# Patient Record
Sex: Male | Born: 1965 | Race: Black or African American | Hispanic: No | Marital: Married | Smoking: Current every day smoker
Health system: Southern US, Community
[De-identification: ages and names within clinical notes are randomized; demographics above are authoritative.]

## PROBLEM LIST (undated history)

## (undated) DIAGNOSIS — I639 Cerebral infarction, unspecified: Secondary | ICD-10-CM

## (undated) DIAGNOSIS — F1010 Alcohol abuse, uncomplicated: Secondary | ICD-10-CM

---

## 2002-09-19 ENCOUNTER — Encounter: Payer: Self-pay | Admitting: Emergency Medicine

## 2002-09-19 ENCOUNTER — Emergency Department (HOSPITAL_COMMUNITY): Admission: EM | Admit: 2002-09-19 | Discharge: 2002-09-20 | Payer: Self-pay | Admitting: Emergency Medicine

## 2007-12-31 ENCOUNTER — Emergency Department (HOSPITAL_COMMUNITY): Admission: EM | Admit: 2007-12-31 | Discharge: 2008-01-01 | Payer: Self-pay | Admitting: Emergency Medicine

## 2012-01-11 ENCOUNTER — Encounter (HOSPITAL_BASED_OUTPATIENT_CLINIC_OR_DEPARTMENT_OTHER): Payer: Self-pay | Admitting: Emergency Medicine

## 2012-01-11 ENCOUNTER — Emergency Department (HOSPITAL_BASED_OUTPATIENT_CLINIC_OR_DEPARTMENT_OTHER)
Admission: EM | Admit: 2012-01-11 | Discharge: 2012-01-11 | Disposition: A | Discharge status: Home / Self Care | Payer: Self-pay | Attending: Emergency Medicine | Admitting: Emergency Medicine

## 2012-01-11 DIAGNOSIS — L02419 Cutaneous abscess of limb, unspecified: Secondary | ICD-10-CM | POA: Insufficient documentation

## 2012-01-11 DIAGNOSIS — L03116 Cellulitis of left lower limb: Secondary | ICD-10-CM

## 2012-01-11 MED ORDER — OXYCODONE-ACETAMINOPHEN 5-325 MG PO TABS
1.0000 | ORAL_TABLET | Freq: Once | ORAL | Status: AC
  Administered 2012-01-11: 1 via ORAL
  Filled 2012-01-11 (×2): qty 1

## 2012-01-11 MED ORDER — CLINDAMYCIN HCL 150 MG PO CAPS
300.0000 mg | ORAL_CAPSULE | Freq: Once | ORAL | Status: AC
Start: 2012-01-11 — End: 2012-01-11
  Administered 2012-01-11: 300 mg via ORAL
  Filled 2012-01-11: qty 2

## 2012-01-11 MED ORDER — CLINDAMYCIN HCL 300 MG PO CAPS: 300.0000 mg | ORAL_CAPSULE | Freq: Three times a day (TID) | ORAL | Status: DC

## 2012-01-11 MED ORDER — OXYCODONE-ACETAMINOPHEN 5-325 MG PO TABS
1.0000 | ORAL_TABLET | Freq: Four times a day (QID) | ORAL | Status: DC | PRN
Start: 2012-01-11 — End: 2013-11-03

## 2012-01-11 NOTE — ED Provider Notes (Signed)
History   This chart was scribed for Ethelda Chick, MD by Sofie Rower. The patient was seen in room MH03/MH03 and the patient's care was started at 4:37PM   CSN: [id number]  Arrival date & time 01/11/12  1600   First MD Initiated Contact with Patient 01/11/12 1637      Chief Complaint  Patient presents with  . Insect Bite    (Consider location/radiation/quality/duration/timing/severity/associated sxs/prior treatment) Patient is a 46 y.o. male presenting with allergic reaction. The history is provided by the patient. No language interpreter was used.  Allergic Reaction The primary symptoms do not include nausea, vomiting, dizziness or rash. Primary symptoms comment: Blister The current episode started more than 2 days ago. The problem has been gradually worsening. This is a new problem.  The onset of the reaction was associated with insect bite/sting. Significant symptoms that are not present include eye redness, flushing or itching.    Derrick Houston is a 46 y.o. male who presents to the Emergency Department complaining of  sudden, progressively worsening insect bite, located at the left lower extremity, onset three days ago, with associated symptoms of fever (temperature unknown), blistering, erythema, and swelling located at the left lower extremity. The pt reports he was working in the attic on Saturday, 01/09/12, and is currently experiencing an aching sensation located at the insect bite location. The pt informs that he is concerned that he may have been bitten by a spider.  The pt denies allergies to any medications.   The pt does not smoke or drink alcohol.     History reviewed. No pertinent past medical history.  History reviewed. No pertinent past surgical history.  No family history on file.  History  Substance Use Topics  . Smoking status: Not on file  . Smokeless tobacco: Former Neurosurgeon  . Alcohol Use: No      Review of Systems  Eyes: Negative for redness.    Gastrointestinal: Negative for nausea and vomiting.  Skin: Negative for flushing, itching and rash.  Neurological: Negative for dizziness.  All other systems reviewed and are negative.    Allergies  Review of patient's allergies indicates no known allergies.  Home Medications   Current Outpatient Rx  Name Route Sig Dispense Refill  . CLINDAMYCIN HCL 300 MG PO CAPS Oral Take 1 capsule (300 mg total) by mouth 3 (three) times daily. 21 capsule 0  . OXYCODONE-ACETAMINOPHEN 5-325 MG PO TABS Oral Take 1-2 tablets by mouth every 6 (six) hours as needed for pain. 15 tablet 0    BP 125/90  Pulse 82  Temp 98.8 F (37.1 C) (Oral)  Resp 18  Ht 5\' 9"  (1.753 m)  Wt 195 lb (88.451 kg)  BMI 28.80 kg/m2  SpO2 98%  Physical Exam  Nursing note and vitals reviewed. Constitutional: He is oriented to person, place, and time. He appears well-developed and well-nourished.       The pt does not appear toxic.   HENT:  Head: Atraumatic.  Right Ear: External ear normal.  Left Ear: External ear normal.  Nose: Nose normal.  Eyes: Conjunctivae normal and EOM are normal.  Neck: Normal range of motion. Neck supple.  Pulmonary/Chest: Effort normal.  Musculoskeletal: Normal range of motion.  Neurological: He is alert and oriented to person, place, and time.  Skin: Skin is warm and dry.       5 mm blister, lesion located at the left anterior tibia, erythema and warmth involving the anterior tibial region, 10-12 cm.  No fluctuance, no induration, no streaking of erythema up the extremity.   Psychiatric: He has a normal mood and affect. His behavior is normal.    ED Course  Procedures (including critical care time)  DIAGNOSTIC STUDIES: Oxygen Saturation is 98% on room air, normal by my interpretation.    COORDINATION OF CARE:    4:54PM- Management of infection and application of antibiotics discussed with patient. Pt agrees with treatment.   Labs Reviewed - No data to display No results  found.   1. Cellulitis of left lower extremity       MDM  Pt presents with area of redness and pain surrounding a possible spider bite.  No evidence of abscess, pt nontoxic in appearance with reassuring vitals.  Pt started on antibiotics in the ED for cellulitis of lower extremity.  Advised to f/u with PMD or here in the ED for a recheck in 48 hours.  Discharged with strict return precautions.  Pt agreeable with plan.    I personally performed the services described in this documentation, which was scribed in my presence. The recorded information has been reviewed and considered.    Ethelda Chick, MD 01/11/12 [phone]

## 2012-01-11 NOTE — ED Notes (Signed)
Poss spider bite to left lower leg x 3 days ago.  blister

## 2012-06-08 ENCOUNTER — Encounter (HOSPITAL_BASED_OUTPATIENT_CLINIC_OR_DEPARTMENT_OTHER): Payer: Self-pay | Admitting: *Deleted

## 2012-06-08 ENCOUNTER — Emergency Department (HOSPITAL_BASED_OUTPATIENT_CLINIC_OR_DEPARTMENT_OTHER)
Admission: EM | Admit: 2012-06-08 | Discharge: 2012-06-08 | Disposition: A | Discharge status: Home / Self Care | Payer: No Typology Code available for payment source | Attending: Emergency Medicine | Admitting: Emergency Medicine

## 2012-06-08 ENCOUNTER — Emergency Department (HOSPITAL_BASED_OUTPATIENT_CLINIC_OR_DEPARTMENT_OTHER): Payer: No Typology Code available for payment source

## 2012-06-08 DIAGNOSIS — Y9389 Activity, other specified: Secondary | ICD-10-CM | POA: Insufficient documentation

## 2012-06-08 DIAGNOSIS — S161XXA Strain of muscle, fascia and tendon at neck level, initial encounter: Secondary | ICD-10-CM

## 2012-06-08 DIAGNOSIS — S139XXA Sprain of joints and ligaments of unspecified parts of neck, initial encounter: Secondary | ICD-10-CM | POA: Insufficient documentation

## 2012-06-08 DIAGNOSIS — S39012A Strain of muscle, fascia and tendon of lower back, initial encounter: Secondary | ICD-10-CM

## 2012-06-08 DIAGNOSIS — Y92410 Unspecified street and highway as the place of occurrence of the external cause: Secondary | ICD-10-CM | POA: Insufficient documentation

## 2012-06-08 DIAGNOSIS — Z87891 Personal history of nicotine dependence: Secondary | ICD-10-CM | POA: Insufficient documentation

## 2012-06-08 DIAGNOSIS — S335XXA Sprain of ligaments of lumbar spine, initial encounter: Secondary | ICD-10-CM | POA: Insufficient documentation

## 2012-06-08 MED ORDER — CYCLOBENZAPRINE HCL 10 MG PO TABS: 10.0000 mg | ORAL_TABLET | Freq: Two times a day (BID) | ORAL | Status: DC | PRN

## 2012-06-08 MED ORDER — IBUPROFEN 800 MG PO TABS: 800.0000 mg | ORAL_TABLET | Freq: Three times a day (TID) | ORAL | Status: DC

## 2012-06-08 MED ORDER — TRAMADOL HCL 50 MG PO TABS: 50.0000 mg | ORAL_TABLET | Freq: Four times a day (QID) | ORAL | Status: DC | PRN
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
## 2013-11-03 ENCOUNTER — Encounter (HOSPITAL_COMMUNITY): Payer: Self-pay | Admitting: Emergency Medicine

## 2013-11-03 ENCOUNTER — Emergency Department (HOSPITAL_COMMUNITY)
Admission: EM | Admit: 2013-11-03 | Discharge: 2013-11-03 | Disposition: A | Discharge status: Home / Self Care | Payer: Worker's Compensation | Attending: Emergency Medicine | Admitting: Emergency Medicine

## 2013-11-03 ENCOUNTER — Emergency Department (HOSPITAL_COMMUNITY): Payer: Worker's Compensation

## 2013-11-03 DIAGNOSIS — W3189XA Contact with other specified machinery, initial encounter: Secondary | ICD-10-CM | POA: Insufficient documentation

## 2013-11-03 DIAGNOSIS — Y9289 Other specified places as the place of occurrence of the external cause: Secondary | ICD-10-CM | POA: Insufficient documentation

## 2013-11-03 DIAGNOSIS — Y990 Civilian activity done for income or pay: Secondary | ICD-10-CM | POA: Insufficient documentation

## 2013-11-03 DIAGNOSIS — Z87891 Personal history of nicotine dependence: Secondary | ICD-10-CM | POA: Insufficient documentation

## 2013-11-03 DIAGNOSIS — S51809A Unspecified open wound of unspecified forearm, initial encounter: Secondary | ICD-10-CM | POA: Insufficient documentation

## 2013-11-03 DIAGNOSIS — S51812A Laceration without foreign body of left forearm, initial encounter: Secondary | ICD-10-CM

## 2013-11-03 DIAGNOSIS — Y9389 Activity, other specified: Secondary | ICD-10-CM | POA: Diagnosis not present

## 2013-11-03 DIAGNOSIS — Z7982 Long term (current) use of aspirin: Secondary | ICD-10-CM | POA: Diagnosis not present

## 2013-11-03 DIAGNOSIS — Z23 Encounter for immunization: Secondary | ICD-10-CM | POA: Insufficient documentation

## 2013-11-03 HISTORY — DX: Alcohol abuse, uncomplicated: F10.10

## 2013-11-03 LAB — CBG MONITORING, ED: Glucose-Capillary: 114 mg/dL — ABNORMAL HIGH (ref 70–99)

## 2013-11-03 MED ORDER — FENTANYL CITRATE 0.05 MG/ML IJ SOLN
50.0000 ug | Freq: Once | INTRAMUSCULAR | Status: AC
  Administered 2013-11-03: 50 ug via INTRAVENOUS
  Filled 2013-11-03: qty 2

## 2013-11-03 MED ORDER — ONDANSETRON HCL 4 MG/2ML IJ SOLN
4.0000 mg | Freq: Once | INTRAMUSCULAR | Status: AC
  Administered 2013-11-03: 4 mg via INTRAVENOUS
  Filled 2013-11-03: qty 2

## 2013-11-03 MED ORDER — TETANUS-DIPHTH-ACELL PERTUSSIS 5-2.5-18.5 LF-MCG/0.5 IM SUSP
0.5000 mL | Freq: Once | INTRAMUSCULAR | Status: AC
  Administered 2013-11-03: 0.5 mL via INTRAMUSCULAR
  Filled 2013-11-03: qty 0.5

## 2013-11-03 MED ORDER — LIDOCAINE-EPINEPHRINE 1 %-1:100000 IJ SOLN
20.0000 mL | Freq: Once | INTRAMUSCULAR | Status: DC
  Filled 2013-11-03: qty 20

## 2013-11-03 MED ORDER — LIDOCAINE-EPINEPHRINE (PF) 1 %-1:200000 IJ SOLN
30.0000 mL | Freq: Once | INTRAMUSCULAR | Status: AC
  Administered 2013-11-03: 30 mL

## 2013-11-03 MED ORDER — OXYCODONE-ACETAMINOPHEN 5-325 MG PO TABS: 1.0000 | ORAL_TABLET | ORAL | Status: DC | PRN

## 2013-11-03 MED ORDER — TETANUS-DIPHTH-ACELL PERTUSSIS 5-2.5-18.5 LF-MCG/0.5 IM SUSP: 0.5000 mL | Freq: Once | INTRAMUSCULAR | Status: DC

## 2013-11-03 MED ORDER — HYDROMORPHONE HCL PF 2 MG/ML IJ SOLN
2.0000 mg | Freq: Once | INTRAMUSCULAR | Status: AC
  Administered 2013-11-03: 2 mg via INTRAVENOUS
  Filled 2013-11-03: qty 1

## 2013-11-03 NOTE — Discharge Instructions (Signed)
Call Dr. Carollee Massedhompson's office for recheck asap.  Keep arm elevated.  Return if pain worsens and severe swelling in forearm.     Laceration Care, Adult A laceration is a cut that goes through all layers of the skin. The cut goes into the tissue beneath the skin. HOME CARE For stitches (sutures) or staples:  Keep the cut clean and dry.  If you have a bandage (dressing), change it at least once a day. Change the bandage if it gets wet or dirty, or as told by your doctor.  Wash the cut with soap and water 2 times a day. Rinse the cut with water. Pat it dry with a clean towel.  Put a thin layer of medicated cream on the cut as told by your doctor.  You may shower after the first 24 hours. Do not soak the cut in water until the stitches are removed.  Only take medicines as told by your doctor.  Have your stitches or staples removed as told by your doctor. For skin adhesive strips:  Keep the cut clean and dry.  Do not get the strips wet. You may take a bath, but be careful to keep the cut dry.  If the cut gets wet, pat it dry with a clean towel.  The strips will fall off on their own. Do not remove the strips that are still stuck to the cut. For wound glue:  You may shower or take baths. Do not soak or scrub the cut. Do not swim. Avoid heavy sweating until the glue falls off on its own. After a shower or bath, pat the cut dry with a clean towel.  Do not put medicine on your cut until the glue falls off.  If you have a bandage, do not put tape over the glue.  Avoid lots of sunlight or tanning lamps until the glue falls off. Put sunscreen on the cut for the first year to reduce your scar.  The glue will fall off on its own. Do not pick at the glue. You may need a tetanus shot if:  You cannot remember when you had your last tetanus shot.  You have never had a tetanus shot. If you need a tetanus shot and you choose not to have one, you may get tetanus. Sickness from tetanus can be  serious. GET HELP RIGHT AWAY IF:   Your pain does not get better with medicine.  Your arm, hand, leg, or foot loses feeling (numbness) or changes color.  Your cut is bleeding.  Your joint feels weak, or you cannot use your joint.  You have painful lumps on your body.  Your cut is red, puffy (swollen), or painful.  You have a red line on the skin near the cut.  You have yellowish-white fluid (pus) coming from the cut.  You have a fever.  You have a bad smell coming from the cut or bandage.  Your cut breaks open before or after stitches are removed.  You notice something coming out of the cut, such as wood or glass.  You cannot move a finger or toe. MAKE SURE YOU:   Understand these instructions.  Will watch your condition.  Will get help right away if you are not doing well or get worse. Document Released: 09/16/2007 Document Revised: 06/22/2011 Document Reviewed: 09/23/2010 [facility] Patient Information 2015 [place], [place]. This information is not intended to replace advice given to you by your health care provider. Make sure you discuss any questions you  have with your health care provider. ° °

## 2013-11-03 NOTE — ED Notes (Signed)
Family at bedside. 

## 2013-11-03 NOTE — ED Notes (Signed)
Pt sts that he was working with a grinder when he cut his L forearm. Pt noted to have two deep lacerations. Not bleeding during this triage. Radial pulses +2. Able to move all digits some difficulty moving L wrist. Pt is A/O at this time.

## 2013-11-03 NOTE — ED Notes (Signed)
John ortho tech at bedside apply splint and sling

## 2013-11-03 NOTE — ED Notes (Signed)
Patient transported to X-ray 

## 2013-11-03 NOTE — ED Provider Notes (Signed)
CSN: [id number]     Arrival date & time 11/03/13  1034 History   First MD Initiated Contact with Patient 11/03/13 1101     Chief Complaint  Patient presents with  . Extremity Laceration     (Consider location/radiation/quality/duration/timing/severity/associated sxs/prior Treatment) HPI 48 y.o. Male with laceration to left forearm from grinder at work. Patient complaining of  Pain in left forearm  Radiating to biceps  Past Medical History  Diagnosis Date  . ETOH abuse    History reviewed. No pertinent past surgical history. History reviewed. No pertinent family history. History  Substance Use Topics  . Smoking status: Former Games developermoker  . Smokeless tobacco: Former NeurosurgeonUser  . Alcohol Use: No    Review of Systems    Allergies  Review of patient's allergies indicates no known allergies.  Home Medications   Prior to Admission medications   Medication Sig Start Date End Date Taking? Authorizing Provider  Aspirin-Acetaminophen (GOODY BODY PAIN) 500-325 MG PACK Take 1 packet by mouth as needed (for pain).   Yes Historical Provider, MD   BP 110/68  Pulse 72  Temp(Src) 97.4 F (36.3 C) (Oral)  Resp 22  Ht 5\' 9"  (1.753 m)  Wt 180 lb (81.647 kg)  BMI 26.57 kg/m2  SpO2 99% Physical Exam  Nursing note and vitals reviewed. Constitutional: He is oriented to person, place, and time. He appears well-developed and well-nourished.  HENT:  Head: Normocephalic and atraumatic.  Eyes: Conjunctivae and EOM are normal. Pupils are equal, round, and reactive to light.  Neck: Normal range of motion. Neck supple.  Cardiovascular: Normal rate, regular rhythm, normal heart sounds and intact distal pulses.   Pulmonary/Chest: Effort normal and breath sounds normal.  Abdominal: Soft.  Musculoskeletal:       Left elbow: He exhibits decreased range of motion, swelling and laceration. He exhibits no effusion and no deformity. Tenderness found.       Arms: 12 cm laceration x 2 left medial volar aspect  of left forearm approximately 10 cm distal to medial epicondyle.  Patient with pain with any wrist flexion/extension, radial pulse 2+, two point discrimination intact.   Neurological: He is alert and oriented to person, place, and time. He has normal reflexes.  Skin: Skin is warm and dry.  Psychiatric: He has a normal mood and affect. His behavior is normal. Judgment and thought content normal.    ED Course  LACERATION REPAIR Date/Time: 11/03/2013 2:02 PM Performed by: Hilario QuarryAY, Latorria Zeoli S Authorized by: Hilario QuarryAY, Nahun Kronberg S Consent: Verbal consent obtained. Risks and benefits: risks, benefits and alternatives were discussed Patient identity confirmed: verbally with patient and arm band Time out: Immediately prior to procedure a "time out" was called to verify the correct patient, procedure, equipment, support staff and site/side marked as required. Body area: upper extremity Laceration length: 12 cm Foreign bodies: no foreign bodies Nerve involvement: none Vascular damage: no Anesthesia: local infiltration Local anesthetic: lidocaine 1% with epinephrine Anesthetic total: 7 ml Patient sedated: no Preparation: Patient was prepped and draped in the usual sterile fashion. Irrigation solution: saline Irrigation method: syringe Amount of cleaning: standard Debridement: none Degree of undermining: none Skin closure: 3-0 Prolene Number of sutures: 5 Technique: simple Approximation: loose Approximation difficulty: simple Patient tolerance: Patient tolerated the procedure well with no immediate complications. Comments: Patient with two lacerations- both 12 cm left volar ulnar aspect of forearm.  Proximal laceration repaired with 7 simple interrupted sutures, distal with 5.  Superficial muscle involved. No fb noted.    (  including critical care time) Labs Review Labs Reviewed  CBG MONITORING, ED - Abnormal; Notable for the following:    Glucose-Capillary 114 (*)    All other components within  normal limits    Imaging Review Dg Forearm Left  11/03/2013   CLINICAL DATA:  Extremity laceration  EXAM: LEFT FOREARM - 2 VIEW  COMPARISON:  None.  FINDINGS: There is no evidence of fracture or other focal bone lesions. Soft tissue laceration along the medial volar aspect of the proximal forearm.  IMPRESSION: Soft tissue laceration of the proximal medial volar aspect of the left forearm without osseous abnormality.   Electronically Signed   By: Elige Ko   On: 11/03/2013 12:03     EKG Interpretation None      MDM   Final diagnoses:  Forearm laceration, left, initial encounter    48 y.o. Male with lacerations to left forearm with muscle involvement.  Hand with fingers held in flexion and patient able to flex and extend wrist but very painful.  Discussed with Dr. Janee Morn and advises loose repair, splint, and he will follow up in office.  Discussed return precautions and specifically s/s infection or compartment syndrome.  Patient and wife voice understanding.     Hilario Quarry, MD 11/03/13 [phone]

## 2015-06-03 ENCOUNTER — Encounter: Payer: Self-pay | Admitting: Physician Assistant

## 2015-06-03 ENCOUNTER — Ambulatory Visit (INDEPENDENT_AMBULATORY_CARE_PROVIDER_SITE_OTHER): Payer: 59 | Admitting: Physician Assistant

## 2015-06-03 VITALS — BP 146/90 | HR 64 | Temp 97.9°F | Resp 16

## 2015-06-03 DIAGNOSIS — Z139 Encounter for screening, unspecified: Secondary | ICD-10-CM | POA: Diagnosis not present

## 2015-06-03 DIAGNOSIS — M5442 Lumbago with sciatica, left side: Secondary | ICD-10-CM

## 2015-06-03 LAB — POCT GLYCOSYLATED HEMOGLOBIN (HGB A1C): HEMOGLOBIN A1C: 5.9

## 2015-06-03 MED ORDER — PREDNISONE 20 MG PO TABS: ORAL_TABLET | ORAL | Status: AC

## 2015-06-03 MED ORDER — METHYLPREDNISOLONE SODIUM SUCC 125 MG IJ SOLR
125.0000 mg | Freq: Once | INTRAMUSCULAR | Status: AC
  Administered 2015-06-03: 125 mg via INTRAMUSCULAR

## 2015-06-03 NOTE — Progress Notes (Signed)
06/03/2015 5:27 PM   DOB: November 22, 1965 / MRN: [id number]  SUBJECTIVE:  Derrick Houston is a 50 y.o. male presenting for the evaluation of stable "achy" right sided low back pain that started 4 days ago. Associated symptoms include tingling, and he denies dysuria, leg weakness.Treatments tried thus far include Norcos, Ibuprofen 800 mg q8 and Flexeril with some relief. He denies fever, nausea, dysuria, frequency and urgency.   He has No Known Allergies.   He  has a past medical history of ETOH abuse.    He  reports that he has quit smoking. He has quit using smokeless tobacco. He reports that he does not drink alcohol or use illicit drugs. He  has no sexual activity history on file. The patient  has no past surgical history on file.  His family history is not on file.  Review of Systems  Constitutional: Negative for fever and chills.  Cardiovascular: Negative for chest pain.  Skin: Negative for rash.  Neurological: Negative for headaches.    Problem list and medications reviewed and updated by myself where necessary, and exist elsewhere in the encounter.   OBJECTIVE:  BP 146/90 mmHg  Pulse 64  Temp(Src) 97.9 F (36.6 C)  Resp 16  SpO2 98% CrCl cannot be calculated (Unknown ideal weight.).  Physical Exam  Constitutional: He is oriented to person, place, and time. He appears well-developed. He does not appear ill.  Eyes: Conjunctivae and EOM are normal. Pupils are equal, round, and reactive to light.  Cardiovascular: Normal rate.   Pulmonary/Chest: Effort normal.  Abdominal: He exhibits no distension.  Musculoskeletal: Normal range of motion.  Neurological: He is alert and oriented to person, place, and time. He has normal strength and normal reflexes. He displays no atrophy and no tremor. No cranial nerve deficit or sensory deficit. He exhibits normal muscle tone. He displays a negative Romberg sign. He displays no seizure activity. Coordination and gait normal. GCS eye subscore  is 4. GCS verbal subscore is 5. GCS motor subscore is 6.  Skin: Skin is warm and dry. He is not diaphoretic.  Psychiatric: He has a normal mood and affect.  Nursing note and vitals reviewed.   Results for orders placed or performed in visit on 06/03/15 (from the past 48 hour(s))  POCT glycosylated hemoglobin (Hb A1C)     Status: None   Collection Time: 06/03/15  5:15 PM  Result Value Ref Range   Hemoglobin A1C 5.9     No results found.  ASSESSMENT AND PLAN  Diagnoses and all orders for this visit:  Screening -     POCT glycosylated hemoglobin (Hb A1C)  Low back pain with radiation, left: He is having severe pain today.  He has tried opioids and states this makes him sick.  He has had steroid injections in the past for similar back complaints with excellent relief.  Will start that today. His pressure is mildly elevated.  This is most likely due to pain today.   -     methylPREDNISolone sodium succinate (SOLU-MEDROL) 125 mg/2 mL injection 125 mg; Inject 2 mLs (125 mg total) into the muscle once. -     predniSONE (DELTASONE) 20 MG tablet; Take 3 in the morning for 3 days, then 2 in the morning for 3 days, and then 1 in the morning for 3 days.    The patient was advised to call or return to clinic if he does not see an improvement in symptoms or to seek the care  of the closest emergency department if he worsens with the above plan.   Deliah Boston, MHS, PA-C [facility] 06/03/2015 5:27 PM

## 2016-06-30 ENCOUNTER — Emergency Department (HOSPITAL_BASED_OUTPATIENT_CLINIC_OR_DEPARTMENT_OTHER)
Admission: EM | Admit: 2016-06-30 | Discharge: 2016-07-01 | Disposition: A | Discharge status: Home / Self Care | Payer: 59 | Attending: Emergency Medicine | Admitting: Emergency Medicine

## 2016-06-30 ENCOUNTER — Encounter (HOSPITAL_BASED_OUTPATIENT_CLINIC_OR_DEPARTMENT_OTHER): Payer: Self-pay | Admitting: Emergency Medicine

## 2016-06-30 ENCOUNTER — Emergency Department (HOSPITAL_BASED_OUTPATIENT_CLINIC_OR_DEPARTMENT_OTHER): Payer: 59

## 2016-06-30 DIAGNOSIS — R059 Cough, unspecified: Secondary | ICD-10-CM

## 2016-06-30 DIAGNOSIS — R0789 Other chest pain: Secondary | ICD-10-CM | POA: Diagnosis not present

## 2016-06-30 DIAGNOSIS — R509 Fever, unspecified: Secondary | ICD-10-CM | POA: Insufficient documentation

## 2016-06-30 DIAGNOSIS — F17200 Nicotine dependence, unspecified, uncomplicated: Secondary | ICD-10-CM | POA: Diagnosis not present

## 2016-06-30 DIAGNOSIS — R05 Cough: Secondary | ICD-10-CM | POA: Diagnosis not present

## 2016-06-30 MED ORDER — ACETAMINOPHEN 500 MG PO TABS
1000.0000 mg | ORAL_TABLET | Freq: Once | ORAL | Status: AC
  Administered 2016-06-30: 1000 mg via ORAL
  Filled 2016-06-30: qty 2

## 2016-06-30 MED ORDER — BENZONATATE 100 MG PO CAPS: 100.0000 mg | ORAL_CAPSULE | Freq: Three times a day (TID) | ORAL | 0 refills | Status: AC

## 2016-06-30 NOTE — Discharge Instructions (Signed)
Please read and follow all provided instructions.  Your diagnoses today include:  1. Cough   2. Fever, unspecified fever cause     Tests performed today include:  Chest x-ray - no pneumonia  Vital signs. See below for your results today.   Medications prescribed:   Tessalon Perles - cough suppressant medication  Take any prescribed medications only as directed.  Home care instructions:  Follow any educational materials contained in this packet. Please continue drinking plenty of fluids. Use over-the-counter cold and flu medications as needed as directed on packaging for symptom relief. You may also use ibuprofen or tylenol as directed on packaging for pain or fever.   BE VERY CAREFUL not to take multiple medicines containing Tylenol (also called acetaminophen). Doing so can lead to an overdose which can damage your liver and cause liver failure and possibly death.   Follow-up instructions: Please follow-up with your primary care provider in the next 3 days for further evaluation of your symptoms.   Return instructions:   Please return to the Emergency Department if you experience worsening symptoms.  Please return if you have a high fever greater than 101 degrees not controlled with over-the-counter medications, persistent vomiting and cannot keep down fluids, or worsening trouble breathing.  Please return if you have any other emergent concerns.  Additional Information:  Your vital signs today were: BP 120/76 (BP Location: Right Arm)    Pulse (!) 102    Temp 99.7 F (37.6 C) (Oral)    Resp 16    Ht 5\' 9"  (1.753 m)    Wt 95.3 kg    SpO2 97%    BMI 31.01 kg/m  If your blood pressure (BP) was elevated above 135/85 this visit, please have this repeated by your doctor within one month.

## 2016-06-30 NOTE — ED Triage Notes (Signed)
Pt c/o tightness in his chest with inspiration

## 2016-06-30 NOTE — ED Provider Notes (Signed)
[facility]   CSN: [id number] Arrival date & time: 06/30/16  2209  By signing my name below, I, Linna Darner, attest that this documentation has been prepared under the direction and in the presence of [employer], PA-C. Electronically Signed: Linna Darner, Scribe. 06/30/2016. 11:47 PM.  History   Chief Complaint Chief Complaint  Patient presents with  . URI    The history is provided by the patient. No language interpreter was used.     HPI Comments: Derrick Houston is a 51 y.o. male who presents to the Emergency Department complaining of a persistent non-productive cough beginning earlier today. He reports associated fevers, chills, and chest tightness. He states he was nauseous earlier but notes this has resolved. Pt has tried acetaminophen and ibuprofen with minimal improvement of his symptoms. Pt reports he tested positive for influenza several weeks ago but notes the symptoms eventually resolved. No h/o asthma. Pt denies abdominal pain, vomiting, diarrhea, ear pain, rhinorrhea, nasal congestion, sore throat, wheezing, or any other associated symptoms.  Past Medical History:  Diagnosis Date  . ETOH abuse     There are no active problems to display for this patient.   History reviewed. No pertinent surgical history.     Home Medications    Prior to Admission medications   Medication Sig Start Date End Date Taking? Authorizing Provider  Aspirin-Acetaminophen (GOODY BODY PAIN) 500-325 MG PACK Take 1 packet by mouth as needed (for pain).    Historical Provider, MD    Family History No family history on file.  Social History Social History  Substance Use Topics  . Smoking status: Current Every Day Smoker  . Smokeless tobacco: Former Neurosurgeon  . Alcohol use No     Allergies   Patient has no known allergies.   Review of Systems Review of Systems  Constitutional: Positive for chills and fever. Negative for fatigue.  HENT: Negative  for congestion, ear pain, rhinorrhea, sinus pressure and sore throat.   Eyes: Negative for redness.  Respiratory: Positive for cough and chest tightness. Negative for wheezing.   Gastrointestinal: Positive for nausea (resolved). Negative for abdominal pain, diarrhea and vomiting.  Genitourinary: Negative for dysuria.  Musculoskeletal: Negative for myalgias and neck stiffness.  Skin: Negative for rash.  Neurological: Negative for headaches.  Hematological: Negative for adenopathy.    Physical Exam Updated Vital Signs BP 120/76 (BP Location: Right Arm)   Pulse (!) 102   Temp 99.7 F (37.6 C) (Oral)   Resp 16   Ht 5\' 9"  (1.753 m)   Wt 210 lb (95.3 kg)   SpO2 97%   BMI 31.01 kg/m   Physical Exam  Constitutional: He appears well-developed and well-nourished. No distress.  HENT:  Head: Normocephalic and atraumatic.  Right Ear: Tympanic membrane, external ear and ear canal normal.  Left Ear: Tympanic membrane, external ear and ear canal normal.  Nose: Nose normal. No mucosal edema or rhinorrhea.  Mouth/Throat: Uvula is midline, oropharynx is clear and moist and mucous membranes are normal. Mucous membranes are not dry. No trismus in the jaw. No uvula swelling. No oropharyngeal exudate, posterior oropharyngeal edema, posterior oropharyngeal erythema or tonsillar abscesses.  Eyes: Conjunctivae and EOM are normal. Right eye exhibits no discharge. Left eye exhibits no discharge.  Neck: Normal range of motion. Neck supple. No tracheal deviation present.  Cardiovascular: Regular rhythm and normal heart sounds.  Tachycardia present.   Mild tachycardia  Pulmonary/Chest: Effort normal and breath sounds normal. No respiratory distress.  He has no wheezes. He has no rales.  Abdominal: Soft. There is no tenderness.  Musculoskeletal: Normal range of motion.  Neurological: He is alert.  Skin: Skin is warm and dry.  Psychiatric: He has a normal mood and affect. His behavior is normal.  Nursing  note and vitals reviewed.   ED Treatments / Results   Radiology Dg Chest 2 View  Result Date: 06/30/2016 CLINICAL DATA:  Cough and chest pain EXAM: CHEST  2 VIEW COMPARISON:  None. FINDINGS: The heart size and mediastinal contours are within normal limits. Both lungs are clear. The visualized skeletal structures are unremarkable. IMPRESSION: No active cardiopulmonary disease. Electronically Signed   By: Jasmine PangKim  Fujinaga M.D.   On: 06/30/2016 22:51    Procedures Procedures (including critical care time)  DIAGNOSTIC STUDIES: Oxygen Saturation is 97% on RA, normal by my interpretation.    COORDINATION OF CARE: 11:53 PM Discussed treatment plan with pt at bedside and pt agreed to plan.  Medications Ordered in ED Medications  acetaminophen (TYLENOL) tablet 1,000 mg (1,000 mg Oral Given 06/30/16 2222)     Initial Impression / Assessment and Plan / ED Course  I have reviewed the triage vital signs and the nursing notes.  Pertinent labs & imaging results that were available during my care of the patient were reviewed by me and considered in my medical decision making (see chart for details).     Vital signs reviewed and are as follows: Vitals:   06/30/16 2217 06/30/16 2322  BP: (!) 141/85 120/76  Pulse: (!) 115 (!) 102  Resp: 20 16  Temp: (!) 100.8 F (38.2 C) 99.7 F (37.6 C)   12:07 AM patient updated on x-ray results. Patient counseled on supportive care for viral URI and s/s to return including worsening symptoms, persistent cough or worsening shortness of breath, persistent fever, persistent vomiting, or if they have any other concerns. Urged to see PCP if symptoms persist for more than 3 days. Patient verbalizes understanding and agrees with plan.    Final Clinical Impressions(s) / ED Diagnoses  Pt symptoms consistent with URI. CXR negative for acute infiltrate. Pt will be discharged with symptomatic treatment.  Discussed return precautions.  Pt is hemodynamically stable & in  NAD prior to discharge. Final diagnoses:  Cough  Fever, unspecified fever cause    New Prescriptions New Prescriptions   BENZONATATE (TESSALON) 100 MG CAPSULE    Take 1 capsule (100 mg total) by mouth every 8 (eight) hours.   I personally performed the services described in this documentation, which was scribed in my presence. The recorded information has been reviewed and is accurate.    Renne CriglerJoshua Marely Apgar, PA-C 07/01/16 0007    Shon Batonourtney F Horton, MD 07/01/16 [phone]

## 2016-06-30 NOTE — ED Triage Notes (Addendum)
Pt with onset of fever, cough and congestion tonight. Pt has taken 1 ibuprofen and 2 aspirin PTA

## 2018-10-05 ENCOUNTER — Other Ambulatory Visit: Payer: Self-pay

## 2018-10-05 ENCOUNTER — Emergency Department (HOSPITAL_BASED_OUTPATIENT_CLINIC_OR_DEPARTMENT_OTHER): Payer: 59

## 2018-10-05 ENCOUNTER — Emergency Department (HOSPITAL_BASED_OUTPATIENT_CLINIC_OR_DEPARTMENT_OTHER)
Admission: EM | Admit: 2018-10-05 | Discharge: 2018-10-05 | Disposition: A | Discharge status: Home / Self Care | Payer: 59 | Attending: Emergency Medicine | Admitting: Emergency Medicine

## 2018-10-05 ENCOUNTER — Encounter (HOSPITAL_BASED_OUTPATIENT_CLINIC_OR_DEPARTMENT_OTHER): Payer: Self-pay | Admitting: Emergency Medicine

## 2018-10-05 DIAGNOSIS — F17210 Nicotine dependence, cigarettes, uncomplicated: Secondary | ICD-10-CM | POA: Insufficient documentation

## 2018-10-05 DIAGNOSIS — R51 Headache: Secondary | ICD-10-CM | POA: Diagnosis not present

## 2018-10-05 DIAGNOSIS — R519 Headache, unspecified: Secondary | ICD-10-CM

## 2018-10-05 DIAGNOSIS — R5381 Other malaise: Secondary | ICD-10-CM

## 2018-10-05 LAB — CBC WITH DIFFERENTIAL/PLATELET
Abs Immature Granulocytes: 0.02 10*3/uL (ref 0.00–0.07)
Basophils Absolute: 0.1 10*3/uL (ref 0.0–0.1)
Basophils Relative: 2 %
Eosinophils Absolute: 0.1 10*3/uL (ref 0.0–0.5)
Eosinophils Relative: 3 %
HCT: 44.7 % (ref 39.0–52.0)
Hemoglobin: 14.6 g/dL (ref 13.0–17.0)
Immature Granulocytes: 0 %
Lymphocytes Relative: 42 %
Lymphs Abs: 2.4 10*3/uL (ref 0.7–4.0)
MCH: 30.7 pg (ref 26.0–34.0)
MCHC: 32.7 g/dL (ref 30.0–36.0)
MCV: 94.1 fL (ref 80.0–100.0)
Monocytes Absolute: 0.6 10*3/uL (ref 0.1–1.0)
Monocytes Relative: 10 %
Neutro Abs: 2.4 10*3/uL (ref 1.7–7.7)
Neutrophils Relative %: 43 %
Platelets: 251 10*3/uL (ref 150–400)
RBC: 4.75 MIL/uL (ref 4.22–5.81)
RDW: 12.6 % (ref 11.5–15.5)
WBC: 5.7 10*3/uL (ref 4.0–10.5)
nRBC: 0 % (ref 0.0–0.2)

## 2018-10-05 LAB — BASIC METABOLIC PANEL
Anion gap: 5 (ref 5–15)
BUN: 16 mg/dL (ref 6–20)
CO2: 24 mmol/L (ref 22–32)
Calcium: 8.6 mg/dL — ABNORMAL LOW (ref 8.9–10.3)
Chloride: 108 mmol/L (ref 98–111)
Creatinine, Ser: 1.19 mg/dL (ref 0.61–1.24)
GFR calc Af Amer: >60 mL/min (ref >60)
GFR calc non Af Amer: >60 mL/min (ref >60)
Glucose, Bld: 110 mg/dL — ABNORMAL HIGH (ref 70–99)
Potassium: 3.8 mmol/L (ref 3.5–5.1)
Sodium: 137 mmol/L (ref 135–145)

## 2018-10-05 MED ORDER — PROMETHAZINE HCL 25 MG PO TABS: 25.0000 mg | ORAL_TABLET | Freq: Three times a day (TID) | ORAL | 0 refills | Status: AC | PRN

## 2018-10-05 MED ORDER — METOCLOPRAMIDE HCL 5 MG/ML IJ SOLN
10.0000 mg | Freq: Once | INTRAMUSCULAR | Status: AC
  Administered 2018-10-05: 10 mg via INTRAVENOUS
  Filled 2018-10-05: qty 2

## 2018-10-05 NOTE — ED Triage Notes (Signed)
Since Monday, pt has experienced headache and visual disturbance along with dizziness and elevated BP. Has hx of this, but has not been prescribed medication. Hx of smoking, but otherwise takes very good care of himself.

## 2018-10-05 NOTE — ED Provider Notes (Signed)
[facility] Provider Note   CSN: [id number] Arrival date & time: 10/05/18  0755    History   Chief Complaint Chief Complaint  Patient presents with   Hypertension    HPI JONATHEN RATHMAN is a 53 y.o. male.     The history is provided by the patient. No language interpreter was used.   BRAYLON GRENDA is a 53 y.o. male who presents to the Emergency Department complaining of HA, HTN.   He began feeling poorly two days ago with headache, malaise and dizziness and thought his blood pressure may be high.  HA was a dull headache, nagging that lasted all day.  It was gradual in onset, not the worst headache of his life. His dizziness felt like an unsteady/off balance feeling, worse with walking, resolves at rest.  Yesterday the headache was worse.  He has continued dizziness, headache today.  Today he checked his BP and it was 223/140 and he presents for evaluation due to concern for hypertension.  Denies fevers, vomiting, numbness.  He has some associated blurred vision, nausea, generalized weakness, poor energy.  No CO exposure.   No prior similar sxs. No tick bite.   Denies medical problems.  Took ibuprofen yesterday - did not help.  +tob, marijuanna use.  Denies EtOH use.  No PCP.  Works as a Games developer.    No known COVID19 exposures.  Past Medical History:  Diagnosis Date   ETOH abuse     There are no active problems to display for this patient.   No past surgical history on file.      Home Medications    Prior to Admission medications   Medication Sig Start Date End Date Taking? Authorizing Provider  Aspirin-Acetaminophen (GOODY BODY PAIN) 500-325 MG PACK Take 1 packet by mouth as needed (for pain).    [provider]  benzonatate (TESSALON) 100 MG capsule Take 1 capsule (100 mg total) by mouth every 8 (eight) hours. 06/30/16   Carlisle Cater, PA-C  promethazine (PHENERGAN) 25 MG tablet Take 1 tablet (25 mg total) by mouth every  8 (eight) hours as needed for nausea or vomiting. 10/05/18   Quintella Reichert, MD    Family History No family history on file.  Social History Social History   Tobacco Use   Smoking status: Current Every Day Smoker    Types: Cigarettes   Smokeless tobacco: Former Systems developer  Substance Use Topics   Alcohol use: No   Drug use: Yes    Frequency: 1.0 times per week    Types: Marijuana    Comment: occassional     Allergies   Patient has no known allergies.   Review of Systems Review of Systems  All other systems reviewed and are negative.    Physical Exam Updated Vital Signs BP (!) 147/103    Pulse 82    Temp 98.3 F (36.8 C) (Oral)    Resp 17    Ht 5\' 9"  (1.753 m)    Wt 90.7 kg    SpO2 99%    BMI 29.53 kg/m   Physical Exam Vitals signs and nursing note reviewed.  Constitutional:      Appearance: He is well-developed.  HENT:     Head: Normocephalic and atraumatic.     Mouth/Throat:     Mouth: Mucous membranes are moist.  Eyes:     Extraocular Movements: Extraocular movements intact.     Pupils: Pupils are equal, round, and reactive to light.  Cardiovascular:     Rate and Rhythm: Normal rate and regular rhythm.     Heart sounds: No murmur.  Pulmonary:     Effort: Pulmonary effort is normal. No respiratory distress.     Breath sounds: Normal breath sounds.  Abdominal:     Palpations: Abdomen is soft.     Tenderness: There is no abdominal tenderness. There is no guarding or rebound.  Musculoskeletal:        General: No swelling or tenderness.  Skin:    General: Skin is warm and dry.     Capillary Refill: Capillary refill takes less than 2 seconds.  Neurological:     General: No focal deficit present.     Mental Status: He is alert and oriented to person, place, and time.     Cranial Nerves: No cranial nerve deficit.     Motor: No weakness.     Comments: Visual fields grossly intact.  5/5 strength in all four extremities with sensation to light touch intact in  all four extremities.  No pronator drift.    Psychiatric:        Mood and Affect: Mood normal.        Behavior: Behavior normal.      ED Treatments / Results  Labs (all labs ordered are listed, but only abnormal results are displayed) Labs Reviewed  BASIC METABOLIC PANEL - Abnormal; Notable for the following components:      Result Value   Glucose, Bld 110 (*)    Calcium 8.6 (*)    All other components within normal limits  CBC WITH DIFFERENTIAL/PLATELET    EKG EKG Interpretation  Date/Time:  Wednesday October 05 2018 08:46:45 EDT Ventricular Rate:  58 PR Interval:    QRS Duration: 92 QT Interval:  422 QTC Calculation: 415 R Axis:   71 Text Interpretation:  Sinus rhythm ST elev, probable normal early repol pattern No significant change since last tracing Confirmed by Tilden Fossaees, Tramar Brueckner [phone]([id number]) on 10/05/2018 8:51:21 AM   Radiology Ct Head Wo Contrast  Result Date: 10/05/2018 CLINICAL DATA:  53 year old male with headache, visual disturbances, dizziness and hypertension EXAM: CT HEAD WITHOUT CONTRAST TECHNIQUE: Contiguous axial images were obtained from the base of the skull through the vertex without intravenous contrast. COMPARISON:  None. FINDINGS: Brain: No evidence of acute infarction, hemorrhage, hydrocephalus, extra-axial collection or mass lesion/mass effect. Small circumscribed water attenuation structure in the inferior aspect of the right basal ganglia in the region of the external capsule likely represents a dilated perivascular space. Vascular: No hyperdense vessel or unexpected calcification. Skull: Normal. Negative for fracture or focal lesion. Sinuses/Orbits: No acute finding. Other: None. IMPRESSION: Negative head CT. Electronically Signed   By: Malachy MoanHeath  McCullough M.D.   On: 10/05/2018 09:17    Procedures Procedures (including critical care time)  Medications Ordered in ED Medications  metoCLOPramide (REGLAN) injection 10 mg (10 mg Intravenous Given 10/05/18 0848)      Initial Impression / Assessment and Plan / ED Course  I have reviewed the triage vital signs and the nursing notes.  Pertinent labs & imaging results that were available during my care of the patient were reviewed by me and considered in my medical decision making (see chart for details).        Patient here for evaluation of gradual onset headache, malaise, generalized weakness. He is non-toxic appearing on evaluation with no focal neurologic deficits. Presentation is not consistent with subarachnoid hemorrhage, meningitis, dural sinus thrombosis, CVA, hypertensive urgency. He is  feeling improved following Reglan administration in the ED. Counseled patient on home care for malaise and headache, unclear source. Discussed with patient findings of borderline elevated blood pressure in the emergency department, presentation is not consistent with hypertensive urgency. Discussed importance of establishing PCP for follow-up as well as return precautions.  Maretta Beeserrence L Jim Wells was evaluated in Emergency Department on 10/05/2018 for the symptoms described in the history of present illness. He was evaluated in the context of the global COVID-19 pandemic, which necessitated consideration that the patient might be at risk for infection with the SARS-CoV-2 virus that causes COVID-19. Institutional protocols and algorithms that pertain to the evaluation of patients at risk for COVID-19 are in a state of rapid change based on information released by regulatory bodies including the CDC and federal and state organizations. These policies and algorithms were followed during the patient's care in the ED.   Final Clinical Impressions(s) / ED Diagnoses   Final diagnoses:  Bad headache  Malaise    ED Discharge Orders         Ordered    promethazine (PHENERGAN) 25 MG tablet  Every 8 hours PRN     10/05/18 0932           Tilden Fossaees, Melis Trochez, MD 10/05/18 [phone]

## 2020-09-03 IMAGING — CT CT HEAD WITHOUT CONTRAST
3 series · 15 of 47 positions shown, 18 images · non-contrast
Comparison: None.

CLINICAL DATA: 53-year-old male with headache, visual disturbances,
dizziness and hypertension

EXAM:
CT HEAD WITHOUT CONTRAST
TECHNIQUE: Contiguous axial images were obtained from the base of the skull
through the vertex without intravenous contrast.

[Series 2: head wo · axial · 0.41mm/px · z∈[-162,-37]mm · 9 of 31 slices shown, 12 images]
[im 3/31  brain]
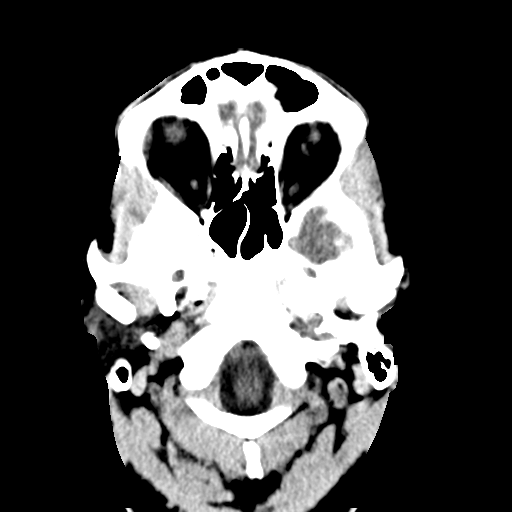
[im 3/31  bone]
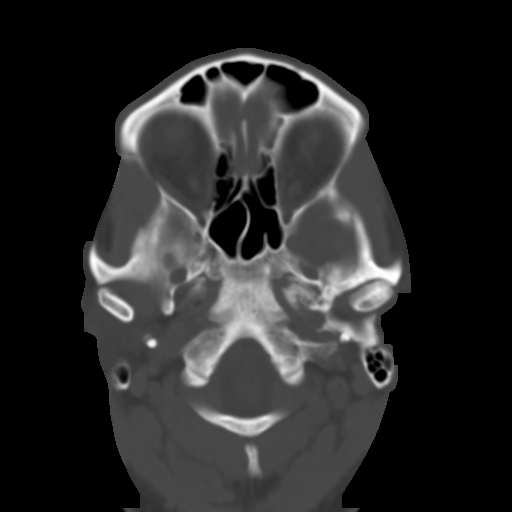
[im 6/31  brain]
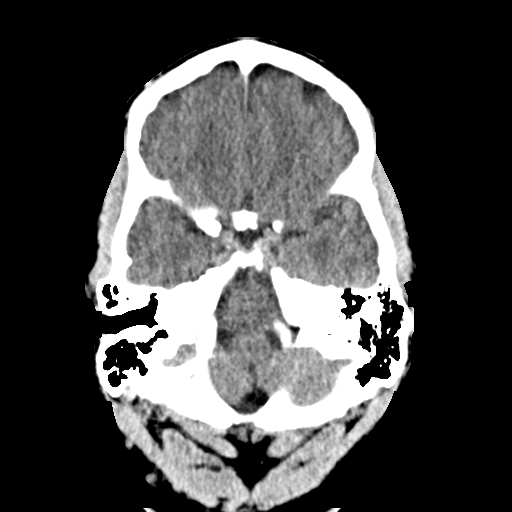
[im 9/31  brain]
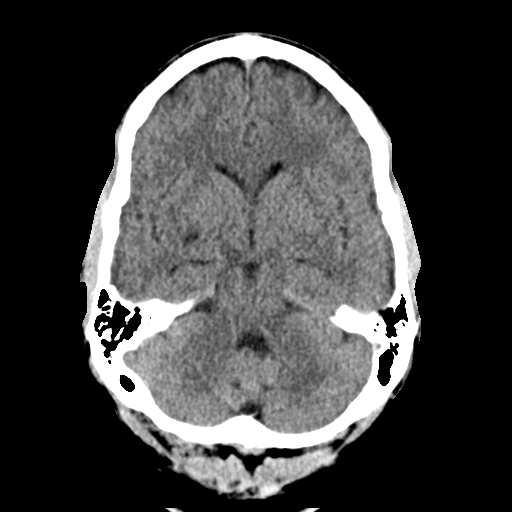
[im 12/31  brain]
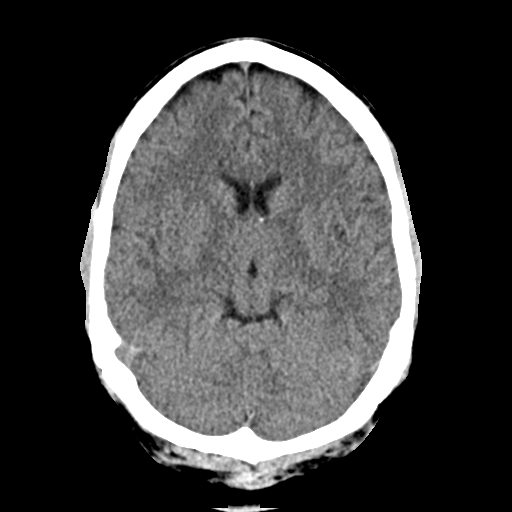
[im 16/31  brain]
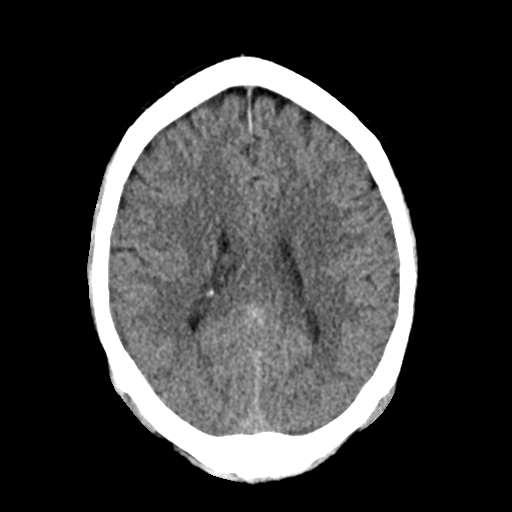
[im 16/31  bone]
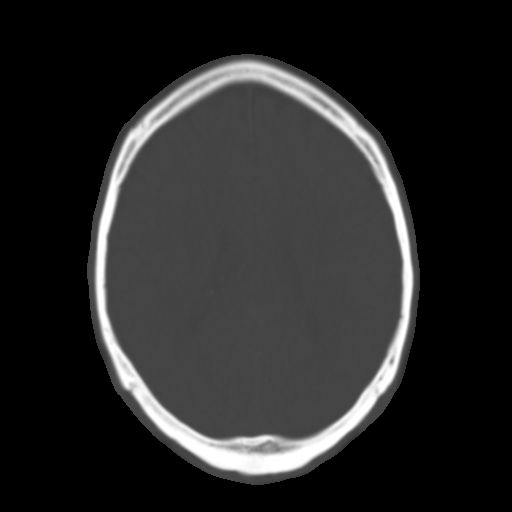
[im 19/31  brain]
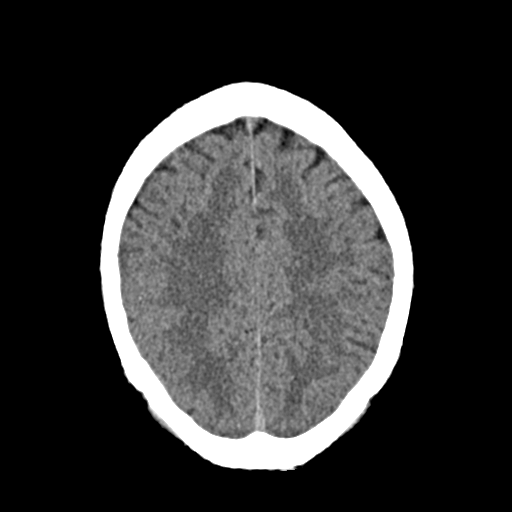
[im 22/31  brain]
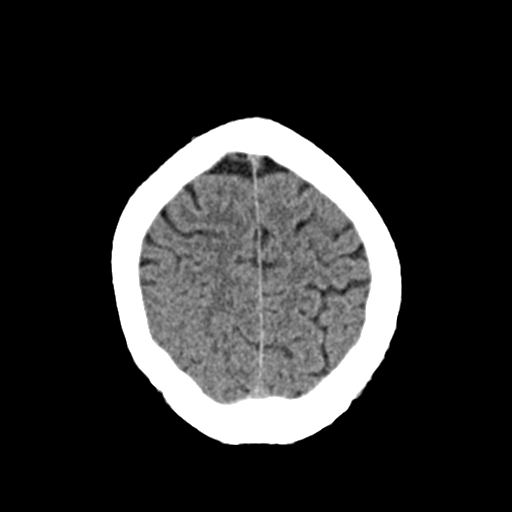
[im 25/31  brain]
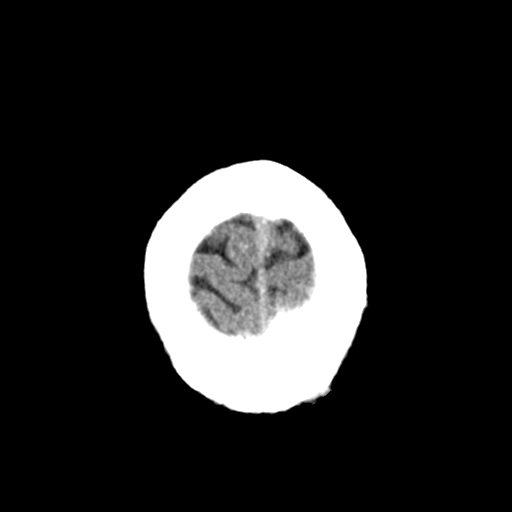
[im 28/31  brain]
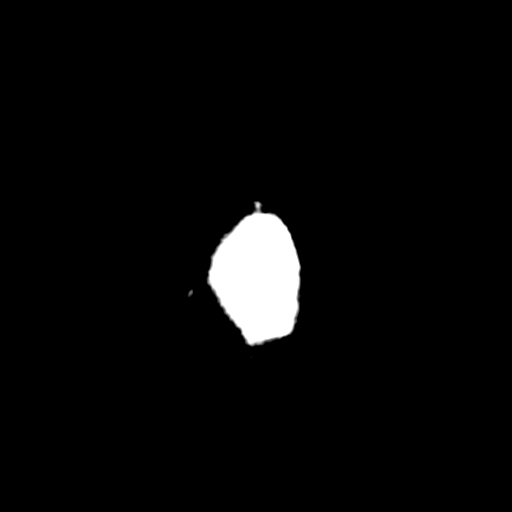
[im 28/31  bone]
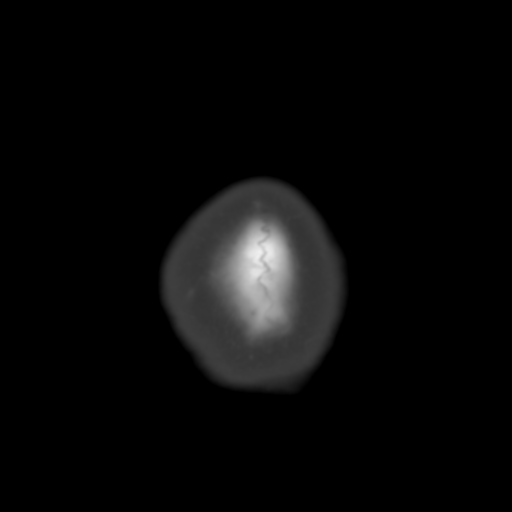

[Series 4: coronal soft · coronal · 0.30mm/px · 3 of 69 slices shown]
[im 23/69  brain]
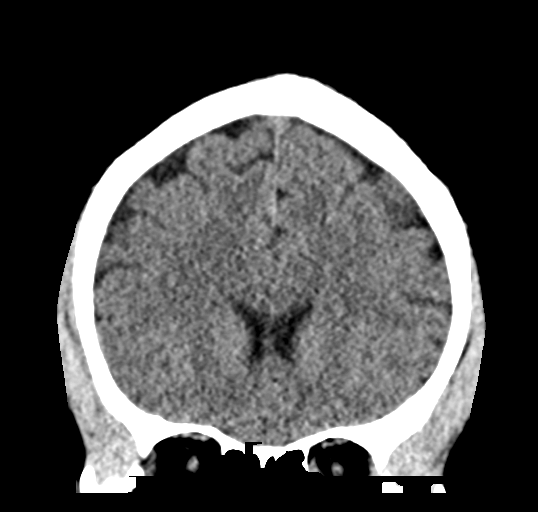
[im 31/69  brain]
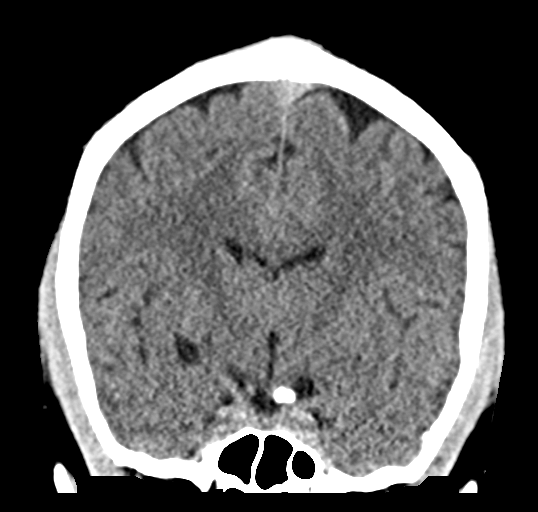
[im 38/69  brain]
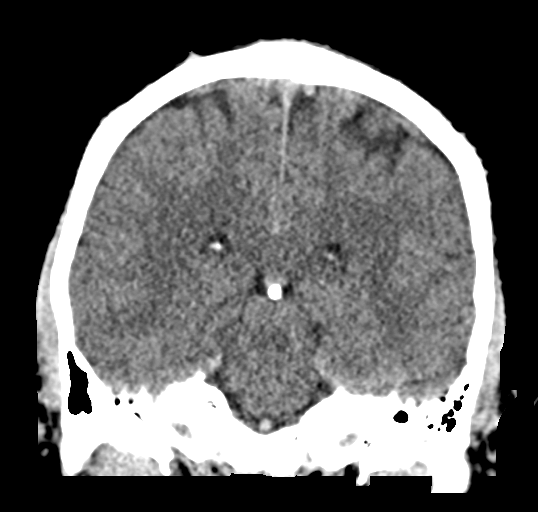

[Series 5: sag soft · sagittal · 0.30mm/px · 3 of 53 slices shown]
[im 18/53  brain]
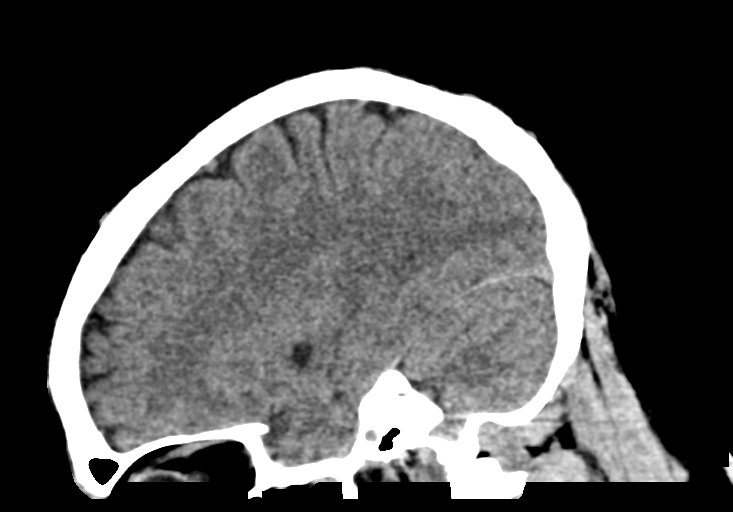
[im 27/53  brain]
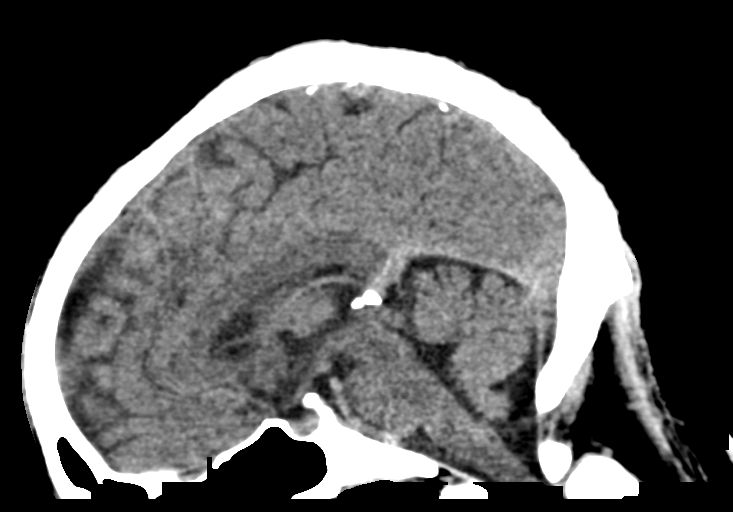
[im 35/53  brain]
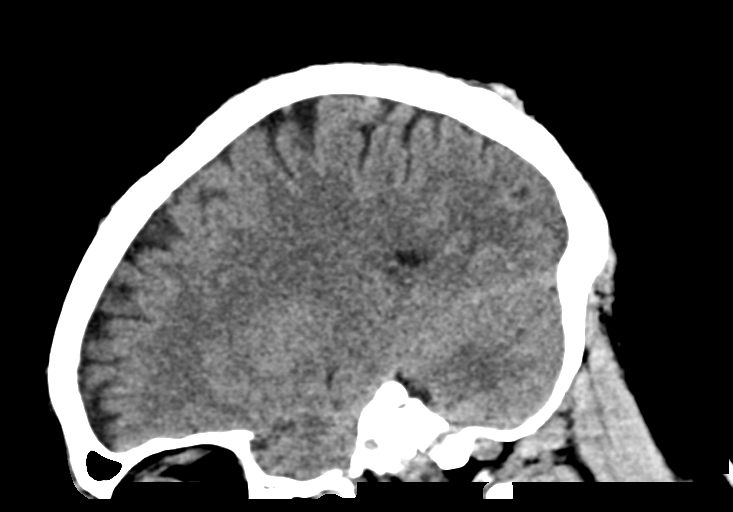

[15 of 47 positions shown; findings below may reference images not displayed]

FINDINGS: Brain: No evidence of acute infarction, hemorrhage, hydrocephalus,
extra-axial collection or mass lesion/mass effect. Small
circumscribed water attenuation structure in the inferior aspect of
the right basal ganglia in the region of the external capsule likely
represents a dilated perivascular space.

Vascular: No hyperdense vessel or unexpected calcification.

Skull: Normal. Negative for fracture or focal lesion.

Sinuses/Orbits: No acute finding.

Other: None.
IMPRESSION: Negative head CT.

## 2023-05-02 ENCOUNTER — Emergency Department (HOSPITAL_BASED_OUTPATIENT_CLINIC_OR_DEPARTMENT_OTHER): Payer: 59

## 2023-05-02 ENCOUNTER — Emergency Department (HOSPITAL_BASED_OUTPATIENT_CLINIC_OR_DEPARTMENT_OTHER)
Admission: EM | Admit: 2023-05-02 | Discharge: 2023-05-02 | Disposition: A | Discharge status: Home / Self Care | Payer: 59 | Attending: Emergency Medicine | Admitting: Emergency Medicine

## 2023-05-02 ENCOUNTER — Encounter (HOSPITAL_BASED_OUTPATIENT_CLINIC_OR_DEPARTMENT_OTHER): Payer: Self-pay

## 2023-05-02 DIAGNOSIS — Z7982 Long term (current) use of aspirin: Secondary | ICD-10-CM | POA: Insufficient documentation

## 2023-05-02 DIAGNOSIS — R519 Headache, unspecified: Secondary | ICD-10-CM | POA: Diagnosis not present

## 2023-05-02 DIAGNOSIS — R4701 Aphasia: Secondary | ICD-10-CM | POA: Insufficient documentation

## 2023-05-02 DIAGNOSIS — R202 Paresthesia of skin: Secondary | ICD-10-CM | POA: Insufficient documentation

## 2023-05-02 DIAGNOSIS — R29810 Facial weakness: Secondary | ICD-10-CM | POA: Insufficient documentation

## 2023-05-02 HISTORY — DX: Cerebral infarction, unspecified: I63.9

## 2023-05-02 LAB — CBC WITH DIFFERENTIAL/PLATELET
Abs Immature Granulocytes: 0.02 10*3/uL (ref 0.00–0.07)
Basophils Absolute: 0.1 10*3/uL (ref 0.0–0.1)
Basophils Relative: 2 %
Eosinophils Absolute: 0.1 10*3/uL (ref 0.0–0.5)
Eosinophils Relative: 2 %
HCT: 45.6 % (ref 39.0–52.0)
Hemoglobin: 15.4 g/dL (ref 13.0–17.0)
Immature Granulocytes: 0 %
Lymphocytes Relative: 52 %
Lymphs Abs: 3.8 10*3/uL (ref 0.7–4.0)
MCH: 31.1 pg (ref 26.0–34.0)
MCHC: 33.8 g/dL (ref 30.0–36.0)
MCV: 92.1 fL (ref 80.0–100.0)
Monocytes Absolute: 0.7 10*3/uL (ref 0.1–1.0)
Monocytes Relative: 10 %
Neutro Abs: 2.5 10*3/uL (ref 1.7–7.7)
Neutrophils Relative %: 34 %
Platelets: 260 10*3/uL (ref 150–400)
RBC: 4.95 MIL/uL (ref 4.22–5.81)
RDW: 13 % (ref 11.5–15.5)
WBC: 7.2 10*3/uL (ref 4.0–10.5)
nRBC: 0 % (ref 0.0–0.2)

## 2023-05-02 LAB — I-STAT CHEM 8, ED
BUN: 17 mg/dL (ref 6–20)
Calcium, Ion: 1.19 mmol/L (ref 1.15–1.40)
Chloride: 106 mmol/L (ref 98–111)
Creatinine, Ser: 1.1 mg/dL (ref 0.61–1.24)
Glucose, Bld: 76 mg/dL (ref 70–99)
HCT: 45 % (ref 39.0–52.0)
Hemoglobin: 15.3 g/dL (ref 13.0–17.0)
Potassium: 4.2 mmol/L (ref 3.5–5.1)
Sodium: 140 mmol/L (ref 135–145)
TCO2: 23 mmol/L (ref 22–32)

## 2023-05-02 LAB — PROTIME-INR
INR: 1 (ref 0.8–1.2)
Prothrombin Time: 12.9 s (ref 11.4–15.2)

## 2023-05-02 LAB — CBG MONITORING, ED: Glucose-Capillary: 81 mg/dL (ref 70–99)

## 2023-05-02 LAB — BASIC METABOLIC PANEL
Anion gap: 7 (ref 5–15)
BUN: 16 mg/dL (ref 6–20)
CO2: 27 mmol/L (ref 22–32)
Calcium: 9.2 mg/dL (ref 8.9–10.3)
Chloride: 103 mmol/L (ref 98–111)
Creatinine, Ser: 1.03 mg/dL (ref 0.61–1.24)
GFR, Estimated: >60 mL/min (ref >60)
Glucose, Bld: 75 mg/dL (ref 70–99)
Potassium: 4.1 mmol/L (ref 3.5–5.1)
Sodium: 137 mmol/L (ref 135–145)

## 2023-05-02 MED ORDER — ACETAMINOPHEN 500 MG PO TABS
1000.0000 mg | ORAL_TABLET | Freq: Once | ORAL | Status: AC
  Administered 2023-05-02: 1000 mg via ORAL
  Filled 2023-05-02: qty 2

## 2023-05-02 MED ORDER — GADOBUTROL 1 MMOL/ML IV SOLN
8.0000 mL | Freq: Once | INTRAVENOUS | Status: AC | PRN
  Administered 2023-05-02: 8 mL via INTRAVENOUS

## 2023-05-02 MED ORDER — LORAZEPAM 2 MG/ML IJ SOLN
1.0000 mg | Freq: Once | INTRAMUSCULAR | Status: AC
  Administered 2023-05-02: 1 mg via INTRAVENOUS
  Filled 2023-05-02: qty 1

## 2023-05-02 MED ORDER — IOHEXOL 350 MG/ML SOLN
75.0000 mL | Freq: Once | INTRAVENOUS | Status: AC | PRN
  Administered 2023-05-02: 75 mL via INTRAVENOUS

## 2023-05-02 NOTE — ED Provider Notes (Signed)
Signout from Dr. Earlene Plater.  58 year old male recent stroke and TNK few days this last week with residual difficulty speech right-sided weakness.  Has new increased numbness on the right side.  CTA negative and getting MRI brain and C-spine.  Neurology Dr. Iver Nestle involved in care.  If these are negative he can be discharged and if positive call back to neurology for further recommendations. Physical Exam  BP (!) 130/96   Pulse 73   Resp 12   Ht 5\' 9"  (1.753 m)   Wt 83.9 kg   SpO2 100%   BMI 27.32 kg/m   Physical Exam  Procedures  Procedures  ED Course / MDM   Clinical Course as of 05/02/23 1536  Sun May 02, 2023  1234 Bhagat: MRI brain/C spine w/wo, if those okay could follow up outpatient [JD]  1330 Called to expedite CTA read. [JD]    Clinical Course User Index [JD] Laurence Spates, MD   Medical Decision Making Amount and/or Complexity of Data Reviewed Labs: ordered. Radiology: ordered.  Risk OTC drugs. Prescription drug management.   MRI of brain and C-spine did not show any acute findings.  I reviewed this with Dr. Iver Nestle.  Patient appropriate for outpatient follow-up.  Reviewed with him and he is comfortable plan.  Return instructions discussed       Terrilee Files, MD 05/03/23 1153

## 2023-05-02 NOTE — ED Notes (Signed)
Patient transported to MRI 

## 2023-05-02 NOTE — ED Triage Notes (Addendum)
States went to bed at 1200, woke up this morning with numbness to right arm and leg. Feels weakness in right leg. Had a stroke on Monday in [place], had similar symptoms Monday (R. Sided weakness, aphasia, facial droop).  Given TNK on Monday for CVA with symptom improvement. Has some residual weakness on right side.

## 2023-05-02 NOTE — Discharge Instructions (Addendum)
You were seen in the emergency department for some numbness on your right side after recent stroke.  Your workup here did not show any sign of new stroke or other concerning findings.  Please follow-up with your primary care doctor and neurology as scheduled.  Continue your regular medications.  Return if any worsening or concerning symptoms.

## 2023-05-02 NOTE — Progress Notes (Signed)
Discussed with EDP Dr. Earlene Plater.  In brief this is a 58 year old gentleman he was recently treated with thrombolytic in [place] for concern for stroke.  He presented with speech difficulty, right arm weakness, right facial droop and sensory changes to the right arm and leg with unremarkable imaging per reports including head CT, CTA, and MRI brain (although his symptoms did not fully resolve).  He remained stable when he went to bed last night at midnight although on awakening felt like he had worsening paresthesias in the right arm and leg today  I recommend MRI brain and cervical spine with and without contrast. Additionally Head CT if any concern for potential CNS bleed but given he is not hypertensive and has otherwise been stable with reassuring neurological examination per ED provider, it does not seem like this is strictly necessary If MRI brain and cervical spine are reassuring outpatient follow-up would be appropriate  "Patient is awake and alert.  Oriented to person, place, time, situation.  No cranial nerve deficits.  Face is symmetric.  He clinically has normal speech although states his speech is not quite back to baseline but is unchanged from yesterday.  He has normal finger-to-nose bilaterally.  He has no visual field cuts.  No neglect.  He has no subjective sensory deficits in the bilateral upper or lower extremities."  7 minutes of care for this patient, majority in direct discussion with ED provider, who has notified patient of neurological consultation  These are curbside recommendations based upon the information readily available in the chart on brief review as well as history and examination information provided to me by requesting provider and do not replace a full detailed consult

## 2023-05-02 NOTE — ED Provider Notes (Signed)
[place] [facility] AT [facility] Provider Note   CSN: [id number] Arrival date & time: 05/02/23  1205     History  Chief Complaint  Patient presents with   Numbness    Derrick Houston is a 58 y.o. male.  HPI 58 year old male history of recent stroke presenting for paresthesia to the right arm and leg.  Patient was in [place] on Monday when he had a stroke.  At the time he had aphasia, right-sided weakness and right-sided facial droop as well as some sensory changes to his right arm and leg.  Per documentation he provides, he had an MRI that did not show anything and had a CT and CTA which were unremarkable but he did receive TNK with improvement in symptoms.  On discharge, he states he still had some slight speech difficulty although improved from his initial presentation as well as some weakness to his right hand, right leg, and some numbness to the right arm and leg.  He was last well last night.  He went to bed around midnight.  When he woke up today he already felt like he had some paresthesia to his right arm and right leg.  He states these are the only new symptoms today.  He last remembers not having this paresthesia last night around midnight.  He does have mild headache.  No chest pain or shortness of breath.  No vision changes.     Home Medications Prior to Admission medications   Medication Sig Start Date End Date Taking? Authorizing Provider  Aspirin-Acetaminophen (GOODY BODY PAIN) 500-325 MG PACK Take 1 packet by mouth as needed (for pain).    [provider]  benzonatate (TESSALON) 100 MG capsule Take 1 capsule (100 mg total) by mouth every 8 (eight) hours. 06/30/16   Renne Crigler, PA-C  promethazine (PHENERGAN) 25 MG tablet Take 1 tablet (25 mg total) by mouth every 8 (eight) hours as needed for nausea or vomiting. 10/05/18   Tilden Fossa, MD      Allergies    Patient has no known allergies.    Review of Systems   Review of  Systems Review of systems completed and notable as per HPI.  ROS otherwise negative.   Physical Exam Updated Vital Signs BP (!) 130/96   Pulse 73   Resp 12   Ht 5\' 9"  (1.753 m)   Wt 83.9 kg   SpO2 100%   BMI 27.32 kg/m  Physical Exam Vitals and nursing note reviewed.  Constitutional:      General: He is not in acute distress.    Appearance: He is well-developed.  HENT:     Head: Normocephalic and atraumatic.     Nose: Nose normal.     Mouth/Throat:     Mouth: Mucous membranes are moist.     Pharynx: Oropharynx is clear.  Eyes:     Extraocular Movements: Extraocular movements intact.     Conjunctiva/sclera: Conjunctivae normal.     Pupils: Pupils are equal, round, and reactive to light.  Cardiovascular:     Rate and Rhythm: Normal rate and regular rhythm.     Pulses: Normal pulses.     Heart sounds: Normal heart sounds. No murmur heard. Pulmonary:     Effort: Pulmonary effort is normal. No respiratory distress.     Breath sounds: Normal breath sounds.  Abdominal:     Palpations: Abdomen is soft.     Tenderness: There is no abdominal tenderness.  Musculoskeletal:  General: No swelling.     Cervical back: Neck supple.  Skin:    General: Skin is warm and dry.     Capillary Refill: Capillary refill takes less than 2 seconds.  Neurological:     Mental Status: He is alert and oriented to person, place, and time.     Comments: Patient is awake and alert.  Oriented to person, place, time, situation.  No cranial nerve deficits.  Face is symmetric.  He clinically has normal speech although states his speech is not quite back to baseline but is unchanged from yesterday.  He has normal finger-to-nose bilaterally.  He has no visual field cuts.  No neglect.  He has no subjective sensory deficits in the bilateral upper or lower extremities.  He has 4-5 strength in the right hand but otherwise normal strength in the right upper extremity.  He has some slight motor drift in the  right lower extremity normal strength in the left lower extremity.  Psychiatric:        Mood and Affect: Mood normal.     ED Results / Procedures / Treatments   Labs (all labs ordered are listed, but only abnormal results are displayed) Labs Reviewed  BASIC METABOLIC PANEL  CBC WITH DIFFERENTIAL/PLATELET  PROTIME-INR  CBG MONITORING, ED  I-STAT CHEM 8, ED    EKG EKG Interpretation Date/Time:  Sunday May 02 2023 12:18:37 EST Ventricular Rate:  79 PR Interval:  175 QRS Duration:  87 QT Interval:  369 QTC Calculation: 423 R Axis:   63  Text Interpretation: Sinus rhythm Confirmed by Fulton Reek [phone]) on 05/02/2023 12:40:45 PM  Radiology CT Angio Head Neck W WO CM Result Date: 05/02/2023 CLINICAL DATA:  Provided history: Neuro deficit, acute, stroke suspected. Right arm and leg numbness. Weakness in right leg. Recent prior stroke (with similar symptoms). EXAM: CT ANGIOGRAPHY HEAD AND NECK WITH AND WITHOUT CONTRAST TECHNIQUE: Multidetector CT imaging of the head and neck was performed using the standard protocol during bolus administration of intravenous contrast. Multiplanar CT image reconstructions and MIPs were obtained to evaluate the vascular anatomy. Carotid stenosis measurements (when applicable) are obtained utilizing NASCET criteria, using the distal internal carotid diameter as the denominator. RADIATION DOSE REDUCTION: This exam was performed according to the departmental dose-optimization program which includes automated exposure control, adjustment of the mA and/or kV according to patient size and/or use of iterative reconstruction technique. CONTRAST:  75mL OMNIPAQUE IOHEXOL 350 MG/ML SOLN COMPARISON:  Head CT 10/05/2018. FINDINGS: CT HEAD FINDINGS Brain: Cerebral volume is normal. Prominent perivascular space within the inferior right basal ganglia. There is no acute intracranial hemorrhage. No demarcated cortical infarct. No extra-axial fluid collection. No evidence  of an intracranial mass. No midline shift. Vascular: No hyperdense vessel. Skull: No calvarial fracture or aggressive osseous lesion. Sinuses/Orbits: No orbital mass or acute orbital finding. No significant paranasal sinus disease. Review of the MIP images confirms the above findings CTA NECK FINDINGS Aortic arch: Standard aortic branching. The visualized thoracic aorta is normal in caliber. Streak/beam hardening artifact arising from a dense left-sided contrast bolus partially obscures the left subclavian artery. Within this limitation, there is no appreciable hemodynamically significant innominate or proximal subclavian artery stenosis. Right carotid system: CCA and ICA patent within the neck without stenosis or significant atherosclerotic disease. No evidence of dissection. Left carotid system: CCA and ICA patent within the neck without stenosis or significant atherosclerotic disease. No evidence of dissection Vertebral arteries: Codominant and patent within the neck without stenosis or  significant atherosclerotic disease. No evidence of dissection. Skeleton: Cervical spondylosis. Other neck: No neck mass or cervical lymphadenopathy. Upper chest: No consolidation within the imaged lung apices. Review of the MIP images confirms the above findings CTA HEAD FINDINGS Anterior circulation: The intracranial internal carotid arteries are patent. The M1 middle cerebral arteries are patent. No M2 proximal branch occlusion or high-grade proximal stenosis. The anterior cerebral arteries are patent. No intracranial aneurysm is identified. Posterior circulation: The intracranial vertebral arteries are patent. The basilar artery is patent. The posterior cerebral arteries are patent. Small posterior communicating arteries are present bilaterally. Venous sinuses: Within the limitations of contrast timing, no convincing thrombus. Anatomic variants: None significant. Review of the MIP images confirms the above findings IMPRESSION:  Non-contrast head CT: No CT evidence of an acute intracranial abnormality. A brain MRI may be obtained for further evaluation, as clinically warranted. CTA neck: The common carotid, internal carotid and vertebral arteries are patent within the neck without stenosis or significant atherosclerotic disease. No evidence of dissection. CTA head: No proximal intracranial large vessel occlusion or high-grade proximal arterial stenosis identified. CS is Electronically Signed   By: Jackey Loge D.O.   On: 05/02/2023 14:20    Procedures Procedures    Medications Ordered in ED Medications  acetaminophen (TYLENOL) tablet 1,000 mg (1,000 mg Oral Given 05/02/23 1308)  iohexol (OMNIPAQUE) 350 MG/ML injection 75 mL (75 mLs Intravenous Contrast Given 05/02/23 1321)  LORazepam (ATIVAN) injection 1 mg (1 mg Intravenous Given 05/02/23 1450)    ED Course/ Medical Decision Making/ A&P Clinical Course as of 05/02/23 1540  Sun May 02, 2023  1234 Bhagat: MRI brain/C spine w/wo, if those okay could follow up outpatient [JD]  1330 Called to expedite CTA read. [JD]    Clinical Course User Index [JD] Laurence Spates, MD                                 Medical Decision Making Amount and/or Complexity of Data Reviewed Labs: ordered. Radiology: ordered.  Risk OTC drugs. Prescription drug management.   Medical Decision Making:   Derrick Houston is a 58 y.o. male who presented to the ED today with right-sided arm and leg paresthesia.  On exam he is well-appearing.  He is recently seen in outside hospital in [place] for stroke and reportedly received TNK.  His CT, CTA and MRI were reportedly negative although I cannot see the actual reports I just have the discharge summary.  He presents today with headache and paresthesia of the right arm and leg.  These are his only new symptoms.  Weakness and speech difficulties are the same as when he left the hospital after his recent stroke.  He was last well without these  new symptoms last night around midnight so he is 12 hours after his last known well and is outside of the TNK window.  He does not have any new aphasia, visual changes, or neglect and is LVO negative.  His blood sugar is normal.  He is mildly hypertensive.  I spoke with Dr. Iver Nestle with neurology immediately upon arrival over the phone.  She recommends MRI of the brain and cervical spine with and without contrast, she feels like these are normal he could recently follow-up outpatient.   Patient placed on continuous vitals and telemetry monitoring while in ED which was reviewed periodically.  Reviewed and confirmed nursing documentation for past medical history, family history,  social history.  Reassessment and Plan:   Patient remained stable.  No new symptoms.  CTA head and neck without acute under mild including no bleeding or LVO.  Lab work is unremarkable.  MRI of the brain and cervical spine are pending.  Handoff given to Dr. Malva Limes with plan to follow-up MRI and reassess.   Patient's presentation is most consistent with acute complicated illness / injury requiring diagnostic workup.           Final Clinical Impression(s) / ED Diagnoses Final diagnoses:  Paresthesia    Rx / DC Orders ED Discharge Orders     None         Laurence Spates, MD 05/02/23 1540

## 2023-05-12 ENCOUNTER — Telehealth: Payer: Self-pay

## 2023-05-12 ENCOUNTER — Ambulatory Visit: Payer: 59 | Attending: Physician Assistant

## 2023-05-12 VITALS — BP 141/95 | HR 81

## 2023-05-12 DIAGNOSIS — R2689 Other abnormalities of gait and mobility: Secondary | ICD-10-CM | POA: Diagnosis present

## 2023-05-12 DIAGNOSIS — M6281 Muscle weakness (generalized): Secondary | ICD-10-CM | POA: Diagnosis present

## 2023-05-12 DIAGNOSIS — R278 Other lack of coordination: Secondary | ICD-10-CM | POA: Diagnosis present

## 2023-05-12 DIAGNOSIS — R2681 Unsteadiness on feet: Secondary | ICD-10-CM | POA: Insufficient documentation

## 2023-05-12 NOTE — Therapy (Signed)
OUTPATIENT PHYSICAL THERAPY NEURO EVALUATION   Patient Name: Derrick Houston MRN: [id number] DOB:[birth year], 58 y.o., male Today's Date: 05/12/2023   PCP: Warnell Forester, PA-C REFERRING PROVIDER: Warnell Forester, PA-C  END OF SESSION:  PT End of Session - 05/12/23 1148     Visit Number 1    Number of Visits 9    Date for PT Re-Evaluation 06/11/23    Authorization Type [employer]    PT Start Time 1147    PT Stop Time 1220    PT Time Calculation (min) 33 min    Activity Tolerance Patient tolerated treatment well    Behavior During Therapy WFL for tasks assessed/performed             Past Medical History:  Diagnosis Date   ETOH abuse    Stroke (HCC)    No past surgical history on file. There are no active problems to display for this patient.   ONSET DATE: 05/11/23 referral  REFERRING DIAG:  W09.81 (ICD-10-CM) - Personal history of transient ischemic attack (TIA), and cerebral infarction without residual deficits  R53.1 (ICD-10-CM) - Weakness of right side of body    THERAPY DIAG:  Muscle weakness (generalized) - Plan: PT plan of care cert/re-cert  Other abnormalities of gait and mobility - Plan: PT plan of care cert/re-cert  Other lack of coordination - Plan: PT plan of care cert/re-cert  Unsteadiness on feet - Plan: PT plan of care cert/re-cert  Rationale for Evaluation and Treatment: Rehabilitation  SUBJECTIVE:                                                                                                                                                                                             SUBJECTIVE STATEMENT: "My blood pressure's a little high." Patient reports constant daily headache since TIA. R foot constantly tingling since as well. Endorses difficulty with steps.  Pt accompanied by: family member  PERTINENT HISTORY: CVA, ETOH abuse  PAIN:  Are you having pain? No  PRECAUTIONS: Fall   WEIGHT BEARING RESTRICTIONS: No  FALLS: Has patient  fallen in last 6 months? No  LIVING ENVIRONMENT: Lives with: lives with their family Lives in: House/apartment Stairs: Yes: Internal: 2 steps; on right going up Has following equipment at home: Single point cane and Grab bars  PLOF: Independent  PATIENT GOALS: "to return to work"  OBJECTIVE:  Note: Objective measures were completed at Evaluation unless otherwise noted.  DIAGNOSTIC FINDINGS: 05/02/23 brain MRI IMPRESSION: Normal examination. No abnormality seen to explain the clinical presentation.  COGNITION: Overall cognitive status: Within functional limits for tasks assessed   SENSATION: Light touch:  Impaired  R foot  COORDINATION: Dysmetric and effortful with R LE heel shin, normal figure 8 B  POSTURE: No Significant postural limitations  LOWER EXTREMITY MMT:    MMT Right Eval Left Eval  Hip flexion 4- 5  Hip extension    Hip abduction 4- 5  Hip adduction 4- 5  Hip internal rotation    Hip external rotation    Knee flexion 4- 5  Knee extension 4- 5  Ankle dorsiflexion 4- 5  Ankle plantarflexion    Ankle inversion    Ankle eversion    (Blank rows = not tested)   STAIRS: Level of Assistance: SBA Stair Negotiation Technique: Step to Pattern Alternating Pattern  with Bilateral Rails Number of Stairs: 4  Height of Stairs: 6    GAIT: Gait pattern: WFL, decreased ankle dorsiflexion- Right, ataxic, and wide BOS Distance walked: clinic Assistive device utilized: None Level of assistance: Complete Independence  FUNCTIONAL TESTS:   [facility] PT Assessment - 05/12/23 0001       Standardized Balance Assessment   Standardized Balance Assessment 10 meter walk test    Five times sit to stand comments  13.34    10 Meter Walk 1.12m/s      Functional Gait  Assessment   Gait assessed  Yes    Gait Level Surface Walks 20 ft in less than 7 sec but greater than 5.5 sec, uses assistive device, slower speed, mild gait deviations, or deviates 6-10 in outside of the 12  in walkway width.    Change in Gait Speed Able to change speed, demonstrates mild gait deviations, deviates 6-10 in outside of the 12 in walkway width, or no gait deviations, unable to achieve a major change in velocity, or uses a change in velocity, or uses an assistive device.    Gait with Horizontal Head Turns Performs head turns smoothly with slight change in gait velocity (eg, minor disruption to smooth gait path), deviates 6-10 in outside 12 in walkway width, or uses an assistive device.    Gait with Vertical Head Turns Performs task with moderate change in gait velocity, slows down, deviates 10-15 in outside 12 in walkway width but recovers, can continue to walk.    Gait and Pivot Turn Pivot turns safely in greater than 3 sec and stops with no loss of balance, or pivot turns safely within 3 sec and stops with mild imbalance, requires small steps to catch balance.    Step Over Obstacle Is able to step over one shoe box (4.5 in total height) but must slow down and adjust steps to clear box safely. May require verbal cueing.    Gait with Narrow Base of Support Is able to ambulate for 10 steps heel to toe with no staggering.    Gait with Eyes Closed Walks 20 ft, slow speed, abnormal gait pattern, evidence for imbalance, deviates 10-15 in outside 12 in walkway width. Requires more than 9 sec to ambulate 20 ft.    Ambulating Backwards Walks 20 ft, uses assistive device, slower speed, mild gait deviations, deviates 6-10 in outside 12 in walkway width.    Steps Alternating feet, must use rail.    Total Score 18              PATIENT SURVEYS:  FOTO staff did not capture  TREATMENT N/a eval   PATIENT EDUCATION: Education details: PT POC, exam findings, safe BP parameters, return to work safety, OT vs PT Person educated: Patient Education method: Psychologist, counselling Education comprehension: verbalized understanding  HOME EXERCISE PROGRAM: To be provided  GOALS: Goals reviewed with patient? Yes  SHORT TERM GOALS: = LTG based on PT POC length   LONG TERM GOALS: Target date: 06/11/23  Pt will be independent with final HEP for improved functional strength and mobility  Baseline: to be provided Goal status: INITIAL  2.  Pt will improve FGA to >/= 23/30 to demonstrate improved balance and reduced fall risk  Baseline: 18/30 Goal status: INITIAL  3.  Patient will negotiate at least 12 steps with no hand rails, reciprocal pattern, independently to demonstrate improved eccentric control of R knee Baseline: 4 steps step-to and required B HR Goal status: INITIAL  4.  Patient will lift at least 50 lbs from the ground to his waist to simulate work-related duties Baseline: unable Goal status: INITIAL    ASSESSMENT:  CLINICAL IMPRESSION: Patient is a 58 y.o. male who was seen today for physical therapy evaluation and treatment for impaired mobility related to TIA. He does present with marked R hemiparesis, which is not typical of a TIA. Five times Sit to Stand Test (FTSS) Method: Use a straight back chair with a solid seat that is 17-18" high. Ask participant to sit on the chair with arms folded across their chest.   Instructions: "Stand up and sit down as quickly as possible 5 times, keeping your arms folded across your chest."   Measurement: Stop timing when the participant touches the chair in sitting the 5th time.  TIME: 13.34 sec  Cut off scores indicative of increased fall risk: >12 sec CVA, >16 sec PD, >13 sec vestibular (ANPTA Core Set of Outcome Measures for Adults with Neurologic Conditions, 2018). 10 Meter Walk Test: Patient instructed to walk 10 meters (32.8 ft) as quickly and as safely as possible at their normal speed x2 and at a fast speed x2. Time measured from 2 meter mark to 8 meter mark to accommodate ramp-up and  ramp-down.  Normal speed: 1.71m/s Cut off scores: <0.4 m/s = household Ambulator, 0.4-0.8 m/s = limited community Ambulator, >0.8 m/s = community Ambulator, >1.2 m/s = crossing a street, <1.0 = increased fall risk MCID 0.05 m/s (small), 0.13 m/s (moderate), 0.06 m/s (significant)  (ANPTA Core Set of Outcome Measures for Adults with Neurologic Conditions, 2018). Patient demonstrates increased fall risk as noted by score of 18/30 on  Functional Gait Assessment.   <22/30 = predictive of falls, <20/30 = fall in 6 months, <18/30 = predictive of falls in PD MCID: 5 points stroke population, 4 points geriatric population (ANPTA Core Set of Outcome Measures for Adults with Neurologic Conditions, 2018). 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 He would benefit from skilled PT services to address the above mentioned deficits.    OBJECTIVE IMPAIRMENTS: Abnormal gait, decreased balance, decreased coordination, decreased knowledge of condition, decreased strength, impaired sensation, and impaired UE functional use.   ACTIVITY LIMITATIONS: carrying, lifting, bending, squatting, stairs, reach over head, locomotion level, and caring for others  PARTICIPATION LIMITATIONS: meal prep, cleaning, interpersonal relationship, driving, shopping, community activity, occupation, and yard work  PERSONAL FACTORS: Age, Fitness, Profession, Time since onset of injury/illness/exacerbation, and Transportation are also affecting patient's functional outcome.  REHAB POTENTIAL: Good  CLINICAL DECISION MAKING: Stable/uncomplicated  EVALUATION COMPLEXITY: Low  PLAN:  PT FREQUENCY: 2x/week  PT DURATION: 4 weeks  PLANNED INTERVENTIONS: 97164- PT Re-evaluation, 97110-Therapeutic exercises, 97530- Therapeutic activity, 97112- Neuromuscular re-education, 97535- Self Care, 40981- Manual therapy, [phone]- Gait training, [phone]- Aquatic Therapy,  Patient/Family education, Balance training, Stair training, Dry Needling, Vestibular training, Visual/preceptual remediation/compensation, and DME instructions  PLAN FOR NEXT SESSION: high level balance, R LE eccentric control, heavy lifting, HEP, CHECK BP   Westley Foots, PT Westley Foots, PT, DPT, CBIS  05/12/2023, 12:47 PM

## 2023-05-12 NOTE — Telephone Encounter (Signed)
Warnell Forester, PA-C,   Derrick Houston was evaluated by PT on 05/12/23.  The patient would benefit from an OT evaluation for R UE weakness/coordination.    If you agree, please place an order in [facility] workque in [facility] or fax the order to [phone].  Thank you, Westley Foots, PT, DPT, [facility] [address] [place], [place]  [address] Phone:  [phone] Fax:  [phone]

## 2023-05-17 ENCOUNTER — Ambulatory Visit: Payer: 59 | Attending: Physician Assistant

## 2023-05-17 VITALS — BP 165/99 | HR 106

## 2023-05-17 DIAGNOSIS — M6281 Muscle weakness (generalized): Secondary | ICD-10-CM | POA: Insufficient documentation

## 2023-05-17 DIAGNOSIS — R2681 Unsteadiness on feet: Secondary | ICD-10-CM | POA: Diagnosis present

## 2023-05-17 DIAGNOSIS — R278 Other lack of coordination: Secondary | ICD-10-CM | POA: Diagnosis present

## 2023-05-17 DIAGNOSIS — R471 Dysarthria and anarthria: Secondary | ICD-10-CM | POA: Insufficient documentation

## 2023-05-17 DIAGNOSIS — R29818 Other symptoms and signs involving the nervous system: Secondary | ICD-10-CM | POA: Diagnosis present

## 2023-05-17 DIAGNOSIS — R4701 Aphasia: Secondary | ICD-10-CM | POA: Diagnosis present

## 2023-05-17 DIAGNOSIS — R29898 Other symptoms and signs involving the musculoskeletal system: Secondary | ICD-10-CM | POA: Insufficient documentation

## 2023-05-17 DIAGNOSIS — R2689 Other abnormalities of gait and mobility: Secondary | ICD-10-CM | POA: Insufficient documentation

## 2023-05-17 NOTE — Therapy (Signed)
OUTPATIENT PHYSICAL THERAPY NEURO TREATMENT   Patient Name: ALEEM ELZA MRN: [id number] DOB:1966-03-08, 58 y.o., male Today's Date: 05/17/2023   PCP: Warnell Forester, PA-C REFERRING PROVIDER: Warnell Forester, PA-C  END OF SESSION:  PT End of Session - 05/17/23 0844     Visit Number 2    Number of Visits 9    Date for PT Re-Evaluation 06/11/23    Authorization Type UHC    PT Start Time 0845    PT Stop Time 0927    PT Time Calculation (min) 42 min    Equipment Utilized During Treatment Gait belt    Activity Tolerance Patient tolerated treatment well    Behavior During Therapy WFL for tasks assessed/performed             Past Medical History:  Diagnosis Date   ETOH abuse    Stroke [facility])    History reviewed. No pertinent surgical history. There are no active problems to display for this patient.   ONSET DATE: 05/11/23 referral  REFERRING DIAG:  W09.81 (ICD-10-CM) - Personal history of transient ischemic attack (TIA), and cerebral infarction without residual deficits  R53.1 (ICD-10-CM) - Weakness of right side of body    THERAPY DIAG:  Muscle weakness (generalized)  Other abnormalities of gait and mobility  Other lack of coordination  Unsteadiness on feet  Rationale for Evaluation and Treatment: Rehabilitation  SUBJECTIVE:                                                                                                                                                                                             SUBJECTIVE STATEMENT: Patient reports doing well. Did have high BP over the weekend, but MD increased BP rx on Friday. Denies falls.  Pt accompanied by: family member  PERTINENT HISTORY: CVA, ETOH abuse  PAIN:  Are you having pain? No  PRECAUTIONS: Fall   PATIENT GOALS: "to return to work"  OBJECTIVE:  Note: Objective measures were completed at Evaluation unless otherwise noted.  DIAGNOSTIC FINDINGS: 05/02/23 brain MRI IMPRESSION: Normal  examination. No abnormality seen to explain the clinical presentation.  TREATMENT THEREX -HEP (see below) -anterior lunge onto Bosu 2x10 B LE -8# med ball slam + squat 2x8    PATIENT EDUCATION: Education details: initial HEP Person educated: Patient Education method: Chief Technology Officer Education comprehension: verbalized understanding  HOME EXERCISE PROGRAM: Access Code: [email or web address] URL: [email or web address] Date: 05/17/2023 Prepared by: Merry Lofty  Exercises - Side Stepping with Resistance at Ankles  - 1 x daily - 7 x weekly - 3 sets - 10 reps - Forward Monster Walks  - 1 x daily - 7 x weekly - 3 sets - 10 reps - Backward Monster Walks  - 1 x daily - 7 x weekly - 3 sets - 10 reps - Runner's Step Up/Down  - 1 x daily - 7 x weekly - 3 sets - 10 reps - Lateral Step Ups  - 1 x daily - 7 x weekly - 3 sets - 10 reps  GOALS: Goals reviewed with patient? Yes  SHORT TERM GOALS: = LTG based on PT POC length   LONG TERM GOALS: Target date: 06/11/23  Pt will be independent with final HEP for improved functional strength and mobility  Baseline: to be provided Goal status: INITIAL  2.  Pt will improve FGA to >/= 23/30 to demonstrate improved balance and reduced fall risk  Baseline: 18/30 Goal status: INITIAL  3.  Patient will negotiate at least 12 steps with no hand rails, reciprocal pattern, independently to demonstrate improved eccentric control of R knee Baseline: 4 steps step-to and required B HR Goal status: INITIAL  4.  Patient will lift at least 50 lbs from the ground to his waist to simulate work-related duties Baseline: unable Goal status: INITIAL    ASSESSMENT:  CLINICAL IMPRESSION: Patient seen for skilled PT session with emphasis on initial HEP prescription and general conditioning. Tolerated exercises well with  reports of R LE fatigue/soreness throughout. Discussed continuing to bias R LE slightly during weight bearing exercises (squats, step ups, etc) for continued progress of strengthening. BP elevated after therex, but no increase in HA or other symptoms. Continue POC.    OBJECTIVE IMPAIRMENTS: Abnormal gait, decreased balance, decreased coordination, decreased knowledge of condition, decreased strength, impaired sensation, and impaired UE functional use.   ACTIVITY LIMITATIONS: carrying, lifting, bending, squatting, stairs, reach over head, locomotion level, and caring for others  PARTICIPATION LIMITATIONS: meal prep, cleaning, interpersonal relationship, driving, shopping, community activity, occupation, and yard work  PERSONAL FACTORS: Age, Fitness, Profession, Time since onset of injury/illness/exacerbation, and Transportation are also affecting patient's functional outcome.   REHAB POTENTIAL: Good  CLINICAL DECISION MAKING: Stable/uncomplicated  EVALUATION COMPLEXITY: Low  PLAN:  PT FREQUENCY: 2x/week  PT DURATION: 4 weeks  PLANNED INTERVENTIONS: 97164- PT Re-evaluation, 97110-Therapeutic exercises, 97530- Therapeutic activity, 97112- Neuromuscular re-education, 97535- Self Care, 09811- Manual therapy, [phone]- Gait training, [phone]- Aquatic Therapy, Patient/Family education, Balance training, Stair training, Dry Needling, Vestibular training, Visual/preceptual remediation/compensation, and DME instructions  PLAN FOR NEXT SESSION: high level balance, R LE eccentric control, heavy lifting, CHECK BP, surge overhead?, did we get OT referral?   Westley Foots, PT Westley Foots, PT, DPT, CBIS  05/17/2023, 9:32 AM

## 2023-05-20 ENCOUNTER — Ambulatory Visit: Payer: 59

## 2023-05-21 ENCOUNTER — Ambulatory Visit: Payer: 59

## 2023-05-21 ENCOUNTER — Telehealth: Payer: Self-pay

## 2023-05-21 VITALS — BP 153/97 | HR 79

## 2023-05-21 DIAGNOSIS — R2689 Other abnormalities of gait and mobility: Secondary | ICD-10-CM

## 2023-05-21 DIAGNOSIS — R278 Other lack of coordination: Secondary | ICD-10-CM

## 2023-05-21 DIAGNOSIS — R2681 Unsteadiness on feet: Secondary | ICD-10-CM

## 2023-05-21 DIAGNOSIS — M6281 Muscle weakness (generalized): Secondary | ICD-10-CM

## 2023-05-21 NOTE — Telephone Encounter (Signed)
 Carola Mace, PA-C,    Just circling back to this: Derrick Houston was evaluated by PT on 05/12/23.  The patient would benefit from an OT evaluation for R UE weakness/coordination.     If you agree, please place an order in [facility] workque in [facility] or fax the order to [phone].   Thank you, Delon DELENA Pop, PT, DPT, [facility]  [address] [place], [place]  [address] Phone:  [phone] Fax:  [phone]

## 2023-05-21 NOTE — Therapy (Signed)
 OUTPATIENT PHYSICAL THERAPY NEURO TREATMENT   Patient Name: Derrick Houston MRN: [id number] DOB:Sep 08, 1965, 58 y.o., male Today's Date: 05/21/2023   PCP: Carola Mace, PA-C REFERRING PROVIDER: Carola Mace, PA-C  END OF SESSION:  PT End of Session - 05/21/23 0840     Visit Number 3    Number of Visits 9    Date for PT Re-Evaluation 06/11/23    Authorization Type UHC    PT Start Time 0845    PT Stop Time 0927    PT Time Calculation (min) 42 min    Activity Tolerance Patient tolerated treatment well    Behavior During Therapy [facility] for tasks assessed/performed             Past Medical History:  Diagnosis Date   ETOH abuse    Stroke [facility])    History reviewed. No pertinent surgical history. There are no active problems to display for this patient.   ONSET DATE: 05/11/23 referral  REFERRING DIAG:  S13.26 (ICD-10-CM) - Personal history of transient ischemic attack (TIA), and cerebral infarction without residual deficits  R53.1 (ICD-10-CM) - Weakness of right side of body    THERAPY DIAG:  Muscle weakness (generalized)  Other abnormalities of gait and mobility  Other lack of coordination  Unsteadiness on feet  Rationale for Evaluation and Treatment: Rehabilitation  SUBJECTIVE:                                                                                                                                                                                             SUBJECTIVE STATEMENT: Patient reports doing fair. Does still have a HA, this worsens over the course of the day. Also endorses slurred speech as the day goes on. Denies falls. Does now have a heart monitor for 4 weeks. Did do an arm workout in his gym.  Pt accompanied by: family member  PERTINENT HISTORY: CVA, ETOH abuse  PAIN:  Are you having pain? Yes: NPRS scale: 4/10 Pain location: head Pain description: just there  PRECAUTIONS: Fall   PATIENT GOALS: to return to work  OBJECTIVE:   Note: Objective measures were completed at Evaluation unless otherwise noted.  DIAGNOSTIC FINDINGS: 05/02/23 brain MRI IMPRESSION: Normal examination. No abnormality seen to explain the clinical presentation.  TREATMENT NMR: -Nustep level 4 x8 mins B UE/LE for neural priming  -115' surge carry  -standing chest press surge 2x5 -trial shoulder press surge x2 -surge dead lift 2x8  -surge upright row 2x10 -modified SLS with medball rolls   PATIENT EDUCATION: Education details: continue HEP, monitor BP Person educated: Patient Education method: Explanation and Handouts Education comprehension: verbalized understanding  HOME EXERCISE PROGRAM: Access Code: [email or web address] URL: [email or web address] Date: 05/17/2023 Prepared by: Delon Pop  Exercises - Side Stepping with Resistance at Ankles  - 1 x daily - 7 x weekly - 3 sets - 10 reps - Forward Monster Walks  - 1 x daily - 7 x weekly - 3 sets - 10 reps - Backward Monster Walks  - 1 x daily - 7 x weekly - 3 sets - 10 reps - Runner's Step Up/Down  - 1 x daily - 7 x weekly - 3 sets - 10 reps - Lateral Step Ups  - 1 x daily - 7 x weekly - 3 sets - 10 reps  GOALS: Goals reviewed with patient? Yes  SHORT TERM GOALS: = LTG based on PT POC length   LONG TERM GOALS: Target date: 06/11/23  Pt will be independent with final HEP for improved functional strength and mobility  Baseline: to be provided Goal status: INITIAL  2.  Pt will improve FGA to >/= 23/30 to demonstrate improved balance and reduced fall risk  Baseline: 18/30 Goal status: INITIAL  3.  Patient will negotiate at least 12 steps with no hand rails, reciprocal pattern, independently to demonstrate improved eccentric control of R knee Baseline: 4 steps step-to and required B HR Goal status: INITIAL  4.  Patient will lift at least 50  lbs from the ground to his waist to simulate work-related duties Baseline: unable Goal status: INITIAL    ASSESSMENT:  CLINICAL IMPRESSION: Patient seen for skilled PT session with emphasis on continuing gross NMR targeting R hemibody. Improvement in power production noted, but poor endurance primarily with core strength noted. Session somewhat limited by exertional HTN. Discussed with patient importance of monitoring this and f/u with PCP. Continue POC.    OBJECTIVE IMPAIRMENTS: Abnormal gait, decreased balance, decreased coordination, decreased knowledge of condition, decreased strength, impaired sensation, and impaired UE functional use.   ACTIVITY LIMITATIONS: carrying, lifting, bending, squatting, stairs, reach over head, locomotion level, and caring for others  PARTICIPATION LIMITATIONS: meal prep, cleaning, interpersonal relationship, driving, shopping, community activity, occupation, and yard work  PERSONAL FACTORS: Age, Fitness, Profession, Time since onset of injury/illness/exacerbation, and Transportation are also affecting patient's functional outcome.   REHAB POTENTIAL: Good  CLINICAL DECISION MAKING: Stable/uncomplicated  EVALUATION COMPLEXITY: Low  PLAN:  PT FREQUENCY: 2x/week  PT DURATION: 4 weeks  PLANNED INTERVENTIONS: 97164- PT Re-evaluation, 97110-Therapeutic exercises, 97530- Therapeutic activity, 97112- Neuromuscular re-education, 97535- Self Care, 02859- Manual therapy, [phone]- Gait training, [phone]- Aquatic Therapy, Patient/Family education, Balance training, Stair training, Dry Needling, Vestibular training, Visual/preceptual remediation/compensation, and DME instructions  PLAN FOR NEXT SESSION: high level balance, R LE eccentric control, heavy lifting, CHECK BP, return to work tasks (overhead, climbing higher stairs)   Delon DELENA Pop, PT Delon DELENA Pop, PT, DPT, CBIS  05/21/2023, 9:34 AM

## 2023-05-24 ENCOUNTER — Ambulatory Visit: Payer: 59 | Admitting: Physical Therapy

## 2023-05-24 VITALS — BP 150/83 | HR 93

## 2023-05-24 DIAGNOSIS — M6281 Muscle weakness (generalized): Secondary | ICD-10-CM

## 2023-05-24 DIAGNOSIS — R2681 Unsteadiness on feet: Secondary | ICD-10-CM

## 2023-05-24 DIAGNOSIS — R2689 Other abnormalities of gait and mobility: Secondary | ICD-10-CM

## 2023-05-24 NOTE — Therapy (Signed)
 OUTPATIENT PHYSICAL THERAPY NEURO TREATMENT   Patient Name: Derrick Houston MRN: [id number] DOB:Sep 10, 1965, 58 y.o., male Today's Date: 05/24/2023   PCP: Debborah Fairly, PA-C REFERRING PROVIDER: Debborah Fairly, PA-C  END OF SESSION:  PT End of Session - 05/24/23 0849     Visit Number 4    Number of Visits 9    Date for PT Re-Evaluation 06/11/23    Authorization Type UHC    PT Start Time 0848    PT Stop Time 0930    PT Time Calculation (min) 42 min    Activity Tolerance Patient tolerated treatment well    Behavior During Therapy WFL for tasks assessed/performed              Past Medical History:  Diagnosis Date   ETOH abuse    Stroke ([facility])    No past surgical history on file. There are no active problems to display for this patient.   ONSET DATE: 05/11/23 referral  REFERRING DIAG:  W09.81 (ICD-10-CM) - Personal history of transient ischemic attack (TIA), and cerebral infarction without residual deficits  R53.1 (ICD-10-CM) - Weakness of right side of body    THERAPY DIAG:  Unsteadiness on feet  Other abnormalities of gait and mobility  Muscle weakness (generalized)  Rationale for Evaluation and Treatment: Rehabilitation  SUBJECTIVE:                                                                                                                                                                                             SUBJECTIVE STATEMENT: Pt reports doing okay, has a headache this morning. No falls.    Pt accompanied by: family member  PERTINENT HISTORY: CVA, ETOH abuse  PAIN:  Are you having pain? Yes: NPRS scale: 4/10 Pain location: head Pain description: "just there"  PRECAUTIONS: Fall   PATIENT GOALS: "to return to work"  OBJECTIVE:  Note: Objective measures were completed at Evaluation unless otherwise noted.  DIAGNOSTIC FINDINGS: 05/02/23 brain MRI IMPRESSION: Normal examination. No abnormality seen to explain the  clinical presentation.  VITALS  Vitals:   05/24/23 0851 05/24/23 0928  BP: (!) 140/85 (!) 150/83  Pulse: 93 93  TREATMENT: Self-care Assessed BP at beginning of session (see above) and WNL for session  Reassessed BP at end of session (see above) and still WNL.    Ther Act  Elliptical level 1 for 8 minutes (4 minutes fwd and 4 minutes retro) for cardiovascular warmup, reciprocal coordination and functional LE strength. RPE of 7/10 following activity.  Due to pt report of RUE weakness, practice push-ups in modified plantigrade on mat x2 reps. Very difficult for pt, so regressed to performing at ballet bar, x8 reps w/2s eccentric and 1s isometric and concentric for improved functional strength. Added to HEP (see bolded below).   NMR  In // bars, alt step up w/march to 8" step while holding 12# KB in goblet, x8 reps per side. Attempted to hold weight OH, but pt unable to stabilize w/RUE and required min A due to instability, so regressed to goblet hold. Pt w/increased difficulty stepping up w/RLE, but no LOB noted. Pt reported feeling "off" balance-wise, but activity was good challenge.  On mat table, plank holds on bosu (black side), 5 x15s holds for improved functional core stability and weightbearing tolerance throughout RUE. Pt able to stabilize Bosu well.  Dribbing basketball w/RUE x230' around gym for improved RUE coordination and return to sport. Pt challenged by this but able to perform if concentrating.  Wall balls w/10# med ball, x10 reps, for improved functional RUE strength, high amplitude movement and OH reach. SBA throughout but no LOB noted.    PATIENT EDUCATION: Education details: additions to HEP, continue working on golfing/basketball to tune into IT trainer  Person educated: Patient Education method: Chief Technology Officer Education  comprehension: verbalized understanding  HOME EXERCISE PROGRAM: Access Code: [email or web address] URL: [email or web address] Date: 05/17/2023 Prepared by: Nickola Baron  Exercises - Side Stepping with Resistance at Ankles  - 1 x daily - 7 x weekly - 3 sets - 10 reps - Forward Monster Walks  - 1 x daily - 7 x weekly - 3 sets - 10 reps - Backward Monster Walks  - 1 x daily - 7 x weekly - 3 sets - 10 reps - Runner's Step Up/Down  - 1 x daily - 7 x weekly - 3 sets - 10 reps - Lateral Step Ups  - 1 x daily - 7 x weekly - 3 sets - 10 reps - Push-Up on Counter  - 1 x daily - 7 x weekly - 3 sets - 10 reps - 1 second hold  GOALS: Goals reviewed with patient? Yes  SHORT TERM GOALS: = LTG based on PT POC length   LONG TERM GOALS: Target date: 06/11/23  Pt will be independent with final HEP for improved functional strength and mobility  Baseline: to be provided Goal status: INITIAL  2.  Pt will improve FGA to >/= 23/30 to demonstrate improved balance and reduced fall risk  Baseline: 18/30 Goal status: INITIAL  3.  Patient will negotiate at least 12 steps with no hand rails, reciprocal pattern, independently to demonstrate improved eccentric control of R knee Baseline: 4 steps step-to and required B HR Goal status: INITIAL  4.  Patient will lift at least 50 lbs from the ground to his waist to simulate work-related duties Baseline: unable Goal status: INITIAL    ASSESSMENT:  CLINICAL IMPRESSION: Emphasis of skilled PT session on single leg stability, high amplitude movement, stabilization of RUE and coordination. Pt's BP more controlled this date but pt continues to report headache at baseline. Pt most challenged by stabilization of  RUE overhead and coordination tasks, so added to HEP to address this. Continue POC.    OBJECTIVE IMPAIRMENTS: Abnormal gait, decreased balance, decreased coordination, decreased knowledge of condition, decreased strength, impaired sensation, and  impaired UE functional use.   ACTIVITY LIMITATIONS: carrying, lifting, bending, squatting, stairs, reach over head, locomotion level, and caring for others  PARTICIPATION LIMITATIONS: meal prep, cleaning, interpersonal relationship, driving, shopping, community activity, occupation, and yard work  PERSONAL FACTORS: Age, Fitness, Profession, Time since onset of injury/illness/exacerbation, and Transportation are also affecting patient's functional outcome.   REHAB POTENTIAL: Good  CLINICAL DECISION MAKING: Stable/uncomplicated  EVALUATION COMPLEXITY: Low  PLAN:  PT FREQUENCY: 2x/week  PT DURATION: 4 weeks  PLANNED INTERVENTIONS: 97164- PT Re-evaluation, 97110-Therapeutic exercises, 97530- Therapeutic activity, 97112- Neuromuscular re-education, 97535- Self Care, 69629- Manual therapy, [phone]- Gait training, [phone]- Aquatic Therapy, Patient/Family education, Balance training, Stair training, Dry Needling, Vestibular training, Visual/preceptual remediation/compensation, and DME instructions  PLAN FOR NEXT SESSION: high level balance, R LE eccentric control, heavy lifting, CHECK BP, return to work tasks (overhead, climbing higher stairs), basketball    Shakirra Buehler E Soledad Budreau, PT, DPT  05/24/2023, 10:01 AM

## 2023-05-26 ENCOUNTER — Ambulatory Visit: Payer: 59 | Admitting: Occupational Therapy

## 2023-05-26 ENCOUNTER — Ambulatory Visit: Payer: 59

## 2023-05-28 ENCOUNTER — Ambulatory Visit: Payer: 59 | Admitting: Occupational Therapy

## 2023-05-28 ENCOUNTER — Other Ambulatory Visit: Payer: Self-pay

## 2023-05-28 ENCOUNTER — Ambulatory Visit: Payer: 59

## 2023-05-28 VITALS — BP 146/92

## 2023-05-28 VITALS — BP 136/95 | HR 90

## 2023-05-28 DIAGNOSIS — R2681 Unsteadiness on feet: Secondary | ICD-10-CM

## 2023-05-28 DIAGNOSIS — R29818 Other symptoms and signs involving the nervous system: Secondary | ICD-10-CM

## 2023-05-28 DIAGNOSIS — M6281 Muscle weakness (generalized): Secondary | ICD-10-CM

## 2023-05-28 DIAGNOSIS — R2689 Other abnormalities of gait and mobility: Secondary | ICD-10-CM

## 2023-05-28 DIAGNOSIS — R278 Other lack of coordination: Secondary | ICD-10-CM

## 2023-05-28 DIAGNOSIS — R29898 Other symptoms and signs involving the musculoskeletal system: Secondary | ICD-10-CM

## 2023-05-28 NOTE — Therapy (Signed)
 OUTPATIENT OCCUPATIONAL THERAPY NEURO EVALUATION  Patient Name: Derrick Houston MRN: [id number] DOB:23-Apr-1965, 58 y.o., male Today's Date: 05/28/2023  PCP: Barnie Mort, PA-C  REFERRING PROVIDER: Arma Heading   END OF SESSION:  OT End of Session - 05/28/23 1155     Visit Number 1    Number of Visits 13    Date for OT Re-Evaluation 07/23/23    Authorization Type U[employer] 2025, no auth required, VL: 90 combined    OT Start Time 1102    OT Stop Time 1147    OT Time Calculation (min) 45 min    Activity Tolerance Patient tolerated treatment well    Behavior During Therapy WFL for tasks assessed/performed             Past Medical History:  Diagnosis Date   ETOH abuse    Stroke (HCC)    No past surgical history on file. There are no active problems to display for this patient.   ONSET DATE: 05/21/23 (referral date)  REFERRING DIAG: I63.9 (ICD-10-CM) - Impending cerebrovascular accident (HCC)   THERAPY DIAG:  Muscle weakness (generalized)  Other lack of coordination  Other symptoms and signs involving the musculoskeletal system  Other symptoms and signs involving the nervous system  Rationale for Evaluation and Treatment: Rehabilitation  SUBJECTIVE:   SUBJECTIVE STATEMENT: Pt reported continued headaches though today is "first day I haven't had a headache this week." Pt reported the more tired he gets, the worse the headaches get.  Pt reported arm strength is getting better though R side is still weak. Pt reported trying to shoot baskets for basketball and "it didn't go so well." Pt reported completing PT exercises, practicing steps and completing push-ups at countertop.  Pt c/o "the lights at night mess with my eyes." Pt reported some difficulty driving at night d/t needing to "refocus after the light hits my eyes."  Pt accompanied by: self  PERTINENT HISTORY: CVA, ETOH abuse, marijuana use, HTN, headache, prediabetes  05/02/23 brain  MRI IMPRESSION: Normal examination. No abnormality seen to explain the clinical presentation.  PRECAUTIONS: Fall  WEIGHT BEARING RESTRICTIONS: No  PAIN:  Are you having pain? No  FALLS: Has patient fallen in last 6 months? No  LIVING ENVIRONMENT:  Lives with: lives with their family Lives in: House/apartment Stairs: Yes: Internal: 2 steps; on right going up Has following equipment at home: Single point cane and Grab bars  PLOF: ind, Occupation: carpentry, currently driving  PATIENT GOALS: "to get back to work"  OBJECTIVE:  Note: Objective measures were completed at Evaluation unless otherwise noted.  HAND DOMINANCE: Right  ADLs: Overall ADLs: ind Transfers/ambulation related to ADLs: ind Eating: ind, including cutting with knife Grooming: ind UB Dressing: ind, no difficulty zippers/buttons LB Dressing: ind with extra time, no difficulty tying shoes Toileting: ind Bathing: ind, some difficulty reaching behind back with R hand though can manage with extra time Tub Shower transfers: ind Equipment: Grab bars and Walk in shower  IADLs: Shopping: ind, some assistance from family, some difficulty carrying heavy cases of water d/t weakness of R side Light housekeeping: ind vacuuming, laundry, dishwashing, sweeping - some fatigue with sweeping and vacuuming "I don't have the stamina I used to have." Meal Prep: often orders fast food currently, different from before the stroke. Pt reported increased difficulty with meal prep. Community mobility: currently driving, some help from family for errands Medication management: daughter assists (different from PLOF)  Handwriting:  Pt reported slower speed of handwriting.  Work-related  tasks: Pt reported unable to complete work-related carpentry tasks, such as difficulty driving nail using hammer with RUE.  MOBILITY STATUS: Independent  POSTURE COMMENTS:  Ind sitting balance  ACTIVITY TOLERANCE: Activity tolerance: increased  fatigue during the day. "I don't have the stamina I used to have."  FUNCTIONAL OUTCOME MEASURES: Quick Dash: 36.4% deficit    UPPER EXTREMITY ROM:    Active ROM Right eval Left eval  Shoulder flexion WFL with extra time [facility]  Shoulder abduction WFL with extra time, v/c to breathe d/t pt holding breath WFL  Shoulder adduction WFL with extra time [facility]  Shoulder extension    Shoulder internal rotation    Shoulder external rotation    Elbow flexion    Elbow extension    Wrist flexion    Wrist extension    Wrist ulnar deviation    Wrist radial deviation    Wrist pronation    Wrist supination    (Blank rows = not tested)  UPPER EXTREMITY MMT:     MMT Right eval Left eval  Shoulder flexion 3+ 5  Shoulder abduction    Shoulder adduction    Shoulder extension    Shoulder internal rotation    Shoulder external rotation    Middle trapezius    Lower trapezius    Elbow flexion    Elbow extension    Wrist flexion    Wrist extension    Wrist ulnar deviation    Wrist radial deviation    Wrist pronation    Wrist supination    (Blank rows = not tested)  HAND FUNCTION: Grip strength: Right: 40, 29, 33 (34 lbs average) lbs; Left: 68, 71, 74 (71 lbs average) lbs  Pt benefited from v/c to breathe during task d/t pt tending to hold breath during task.  COORDINATION: 9 Hole Peg test: Right: 24 sec; Left: 23 sec  Pt reported coordination is "off a little bit." Pt reported difficulty using TV remote. Pt reported dropping phone several times this week.  SENSATION: Pt denied tingling/numbness in BUE. Pt reported tingling in R foot "front half towards the toes. "  EDEMA: none noted  MUSCLE TONE: RUE: Within functional limits and LUE: Within functional limits  COGNITION: Overall cognitive status: Within functional limits for tasks assessed  Pt reported some difficulty remembering all seasoning and other ingredients when preparing a meal though recalls ingredients with extra  time. Pt reported "I can't get my words and everything to line up." Pt reported some difficulty with word-finding. Pt reported some very mild slurred speech later in the evening. Pt reported difficulties with short-term memory.  OT noted slightly increased time for pt to respond though pt answered questions appropriately.  Sunshine, pencil, bed: Pt recalled 3/3 words after approx. 3 minutes.  VISION: Subjective report: Pt reported lights "mess with my eyes at night" when driving and when riding in vehicle. Pt accurately read clock on wall. Baseline vision: Wears glasses for reading only Visual history:  none noted  VISION ASSESSMENT: Tracking: Visual tracking to all 4 quadrants, no concerns noted.  PERCEPTION: Not tested  PRAXIS: Not tested  OBSERVATIONS: Pt ambulated ind without A/E. Pt was pleasant and appeared well-kept.  TREATMENT DATE:    Self-Care Pt reported monitoring BP this morning and reported reading of 145/87. Pt denied dizziness, lightheadedness, headache during OT session today. BP assessed, see vital signs. Pt asymptomatic. Pt reported monitoring BP and taking medications today.  Pt reported that MD did not specify any precautions related to driving. Pt reported some difficulty with vision at night.    OT educated pt on driving safety, recommended avoiding driving at night d/t safety concerns, diet/hydration and effect on BP, BP management, and importance of breathing during exercises and tasks. Pt verbalized understanding of all. Pt benefited from v/c during tasks to breathe d/t pt tendency to hold breath during tasks.   BP assessed at end of OT session, see vital signs. BP noted to be high though pt asymptomatic. OT updated PT on vital signs d/t pt's scheduled PT appointment after OT session today.  PATIENT EDUCATION: Education details: see  today's tx above Person educated: Patient Education method: Explanation Education comprehension: verbalized understanding  HOME EXERCISE PROGRAM: TBD   GOALS: Goals reviewed with patient? Yes  SHORT TERM GOALS: Target date: 06/25/23  Patient will demonstrate initial RUE HEP with visual handouts and 25% verbal cues or less for proper execution.  Baseline: new to outpt OT Goal status: INITIAL  2.  Pt will recall or demo at least 3 memory compensation strategies. Baseline: Pt reported mild difficulty with word-finding and short-term memory recall. Goal status: INITIAL  3.  Pt will demonstrate ability to retrieve a 3 lbs object from overhead shelf at 115*with RUE, demonstrating good control.  Baseline: Pt reported and demo'd decreased RUE grip strength, extra time to achieve full shoulder ROM of RUE. Goal status: INITIAL  4.  Pt will report improved efficiency and decreased fatigue when completing ADL LB dressing and IADL household management tasks, indicating improved activity tolerance.  Baseline: Pt reported extra time required for LB dressing and increased fatigue with IADL household management tasks. Goal status: INITIAL  LONG TERM GOALS: Target date: 07/23/23  Patient will ind demonstrate updated RUE HEP with visual handouts. Baseline: new to outpt OT Goal status: INITIAL  2.  Pt will demo at least 50 lbs (average of 3 trials) RUE grip strength as needed to carpentry tasks. Baseline: Grip strength: Right: 40, 29, 33 (34 lbs average) lbs; Left: 68, 71, 74 (71 lbs average) lbs Goal status: INITIAL  3.  Patient will demo improved FM coordination as evidenced by completing nine-hole peg with use of RUE in 22 seconds or less.  Baseline: 9 Hole Peg test: Right: 24 sec; Left: 23 sec Goal status: INITIAL  4.  Patient will demonstrate at least 16% improvement with quick Dash score (reporting 20.4% disability or less) indicating improved functional use of affected extremity.    Baseline: 36.4% deficit Goal status: INITIAL  5.  Pt will ind complete simple simulated medication management task with 100% accuracy.  Baseline: Pt's daughter assists pt with managing medications. Goal status: INITIAL  6.  Pt will report no more than moderate difficulty with work-related carpentry tasks per QuickDASH. Baseline: Pt reported unable to complete carpentry tasks for work per [employer]. Goal status: INITIAL  ASSESSMENT:  CLINICAL IMPRESSION: Patient is a 58 y.o. male who was seen today for occupational therapy evaluation for Impending cerebrovascular accident. Hx includes CVA, ETOH abuse, marijuana use, HTN, headache, prediabetes. Patient currently presents below baseline level of functioning demonstrating functional deficits and impairments as noted below. Pt would benefit from skilled OT services in the outpatient setting to  work on impairments as noted below to help pt return to PLOF as able.     PERFORMANCE DEFICITS: in functional skills including ADLs, IADLs, coordination, dexterity, proprioception, ROM, strength, flexibility, Fine motor control, Gross motor control, mobility, balance, body mechanics, endurance, vision, and UE functional use, cognitive skills including attention, energy/drive, and memory, and psychosocial skills including environmental adaptation.   IMPAIRMENTS: are limiting patient from ADLs, IADLs, rest and sleep, work, leisure, and social participation.   CO-MORBIDITIES: may have co-morbidities  that affects occupational performance. Patient will benefit from skilled OT to address above impairments and improve overall function.  MODIFICATION OR ASSISTANCE TO COMPLETE EVALUATION: No modification of tasks or assist necessary to complete an evaluation.  OT OCCUPATIONAL PROFILE AND HISTORY: Detailed assessment: Review of records and additional review of physical, cognitive, psychosocial history related to current functional performance.  CLINICAL DECISION  MAKING: Moderate - several treatment options, min-mod task modification necessary  REHAB POTENTIAL: Good  EVALUATION COMPLEXITY: Moderate    PLAN:  OT FREQUENCY: 2x/week  OT DURATION: 6 weeks (dates extended to allow for scheduling)  PLANNED INTERVENTIONS: 40981 OT Re-evaluation, 97535 self care/ADL training, 19147 therapeutic exercise, 97530 therapeutic activity, 97112 neuromuscular re-education, 97140 manual therapy, 97035 ultrasound, 97018 paraffin, 82956 fluidotherapy, 97010 moist heat, 97010 cryotherapy, 97760 Orthotics management and training, 21308 Splinting (initial encounter), [id number] Subsequent splinting/medication, passive range of motion, functional mobility training, visual/perceptual remediation/compensation, energy conservation, patient/family education, and DME and/or AE instructions  RECOMMENDED OTHER SERVICES: Pt attending PT sessions  CONSULTED AND AGREED WITH PLAN OF CARE: Patient  PLAN FOR NEXT SESSION:  Monitor BP Shoulder IR ROM with cane + Theraband HEP - red or green FM coordination HEP Theraputty HEP - green  Wynetta Emery, OT 05/28/2023, 12:23 PM

## 2023-05-28 NOTE — Therapy (Signed)
OUTPATIENT PHYSICAL THERAPY NEURO TREATMENT   Patient Name: Derrick Houston MRN: [id number] DOB:[birth year], 58 y.o., male Today's Date: 05/28/2023   PCP: Warnell Forester, PA-C REFERRING PROVIDER: Warnell Forester, PA-C  END OF SESSION:  PT End of Session - 05/28/23 1111     Visit Number 5    Number of Visits 9    Date for PT Re-Evaluation 06/11/23    Authorization Type [employer]    PT Start Time 1148   received from OT   PT Stop Time 1229    PT Time Calculation (min) 41 min    Equipment Utilized During Treatment Gait belt    Activity Tolerance Patient tolerated treatment well    Behavior During Therapy WFL for tasks assessed/performed              Past Medical History:  Diagnosis Date   ETOH abuse    Stroke [facility])    History reviewed. No pertinent surgical history. There are no active problems to display for this patient.   ONSET DATE: 05/11/23 referral  REFERRING DIAG:  W09.81 (ICD-10-CM) - Personal history of transient ischemic attack (TIA), and cerebral infarction without residual deficits  R53.1 (ICD-10-CM) - Weakness of right side of body    THERAPY DIAG:  Unsteadiness on feet  Other abnormalities of gait and mobility  Muscle weakness (generalized)  Other lack of coordination  Rationale for Evaluation and Treatment: Rehabilitation  SUBJECTIVE:                                                                                                                                                                                             SUBJECTIVE STATEMENT: Patient received from OT. Denies falls. Has not heard results of Venogram yet.   Pt accompanied by: family member  PERTINENT HISTORY: CVA, ETOH abuse  PAIN:  Are you having pain? No  PRECAUTIONS: Fall   PATIENT GOALS: "to return to work"  OBJECTIVE:  Note: Objective measures were completed at Evaluation unless otherwise noted.  DIAGNOSTIC FINDINGS: 05/02/23 brain MRI IMPRESSION: Normal examination.  No abnormality seen to explain the clinical presentation.  VITALS  Vitals:   05/28/23 1159  BP: (!) 146/92  TREATMENT: NMR:  -scifit level 5 x10 mins (BP check after 5 mins) for neural priming  -standing on Bosu in // bars  -U UE scarf toss-> B UE scarf toss   -4# med ball chest pass  -4# med ball bball shot simulation -R LE D2 blue theraband resistance  -R LE step up 8" + drive L LE   PATIENT EDUCATION: Education details: continue HEP Person educated: Patient Education method: Explanation and Handouts Education comprehension: verbalized understanding  HOME EXERCISE PROGRAM: Access Code: [email or web address] URL: [email or web address] Date: 05/17/2023 Prepared by: Merry Lofty  Exercises - Side Stepping with Resistance at Ankles  - 1 x daily - 7 x weekly - 3 sets - 10 reps - Forward Monster Walks  - 1 x daily - 7 x weekly - 3 sets - 10 reps - Backward Monster Walks  - 1 x daily - 7 x weekly - 3 sets - 10 reps - Runner's Step Up/Down  - 1 x daily - 7 x weekly - 3 sets - 10 reps - Lateral Step Ups  - 1 x daily - 7 x weekly - 3 sets - 10 reps - Push-Up on Counter  - 1 x daily - 7 x weekly - 3 sets - 10 reps - 1 second hold  GOALS: Goals reviewed with patient? Yes  SHORT TERM GOALS: = LTG based on PT POC length   LONG TERM GOALS: Target date: 06/11/23  Pt will be independent with final HEP for improved functional strength and mobility  Baseline: to be provided Goal status: INITIAL  2.  Pt will improve FGA to >/= 23/30 to demonstrate improved balance and reduced fall risk  Baseline: 18/30 Goal status: INITIAL  3.  Patient will negotiate at least 12 steps with no hand rails, reciprocal pattern, independently to demonstrate improved eccentric control of R knee Baseline: 4 steps step-to and required B HR Goal status: INITIAL  4.   Patient will lift at least 50 lbs from the ground to his waist to simulate work-related duties Baseline: unable Goal status: INITIAL    ASSESSMENT:  CLINICAL IMPRESSION: Patient seen for skilled PT session with emphasis on R LE NMR. Most limited by proximal R LE weakness, but functional. Appropriate balance strategies utilized on compliant surface, such as Bosu ball. Would continue to benefit from higher power exercises and greater balance challenges with dual task overlay. Continue POC.    OBJECTIVE IMPAIRMENTS: Abnormal gait, decreased balance, decreased coordination, decreased knowledge of condition, decreased strength, impaired sensation, and impaired UE functional use.   ACTIVITY LIMITATIONS: carrying, lifting, bending, squatting, stairs, reach over head, locomotion level, and caring for others  PARTICIPATION LIMITATIONS: meal prep, cleaning, interpersonal relationship, driving, shopping, community activity, occupation, and yard work  PERSONAL FACTORS: Age, Fitness, Profession, Time since onset of injury/illness/exacerbation, and Transportation are also affecting patient's functional outcome.   REHAB POTENTIAL: Good  CLINICAL DECISION MAKING: Stable/uncomplicated  EVALUATION COMPLEXITY: Low  PLAN:  PT FREQUENCY: 2x/week  PT DURATION: 4 weeks  PLANNED INTERVENTIONS: 97164- PT Re-evaluation, 97110-Therapeutic exercises, 97530- Therapeutic activity, 97112- Neuromuscular re-education, 97535- Self Care, 16109- Manual therapy, [phone]- Gait training, [phone]- Aquatic Therapy, Patient/Family education, Balance training, Stair training, Dry Needling, Vestibular training, Visual/preceptual remediation/compensation, and DME instructions  PLAN FOR NEXT SESSION: high level balance, R LE eccentric control, heavy lifting, CHECK BP, return to work tasks (overhead, climbing higher stairs), basketball    Westley Foots, PT Westley Foots, PT, DPT, CBIS 05/28/2023, 12:35 PM

## 2023-05-31 ENCOUNTER — Ambulatory Visit: Payer: 59

## 2023-05-31 ENCOUNTER — Ambulatory Visit: Payer: 59 | Admitting: Physical Therapy

## 2023-05-31 ENCOUNTER — Ambulatory Visit: Payer: 59 | Admitting: Occupational Therapy

## 2023-05-31 VITALS — BP 127/85 | HR 78

## 2023-05-31 DIAGNOSIS — M6281 Muscle weakness (generalized): Secondary | ICD-10-CM

## 2023-05-31 DIAGNOSIS — R29818 Other symptoms and signs involving the nervous system: Secondary | ICD-10-CM

## 2023-05-31 DIAGNOSIS — R2681 Unsteadiness on feet: Secondary | ICD-10-CM

## 2023-05-31 DIAGNOSIS — R2689 Other abnormalities of gait and mobility: Secondary | ICD-10-CM

## 2023-05-31 DIAGNOSIS — R278 Other lack of coordination: Secondary | ICD-10-CM

## 2023-05-31 DIAGNOSIS — R29898 Other symptoms and signs involving the musculoskeletal system: Secondary | ICD-10-CM

## 2023-05-31 DIAGNOSIS — R471 Dysarthria and anarthria: Secondary | ICD-10-CM

## 2023-05-31 DIAGNOSIS — R4701 Aphasia: Secondary | ICD-10-CM

## 2023-05-31 NOTE — Therapy (Signed)
 OUTPATIENT PHYSICAL THERAPY NEURO TREATMENT   Patient Name: CANDY ZIEGLER MRN: [id number] DOB:12/07/1965, 58 y.o., male Today's Date: 05/31/2023   PCP: Warnell Forester, PA-C REFERRING PROVIDER: Warnell Forester, PA-C  END OF SESSION:  PT End of Session - 05/31/23 0848     Visit Number 6    Number of Visits 9    Date for PT Re-Evaluation 06/11/23    Authorization Type U[employer]    PT Start Time 0847    PT Stop Time 0928    PT Time Calculation (min) 41 min    Equipment Utilized During Treatment Gait belt    Activity Tolerance Patient tolerated treatment well    Behavior During Therapy WFL for tasks assessed/performed               Past Medical History:  Diagnosis Date   ETOH abuse    Stroke (HCC)    No past surgical history on file. There are no active problems to display for this patient.   ONSET DATE: 05/11/23 referral  REFERRING DIAG:  W09.81 (ICD-10-CM) - Personal history of transient ischemic attack (TIA), and cerebral infarction without residual deficits  R53.1 (ICD-10-CM) - Weakness of right side of body    THERAPY DIAG:  Unsteadiness on feet  Other abnormalities of gait and mobility  Muscle weakness (generalized)  Rationale for Evaluation and Treatment: Rehabilitation  SUBJECTIVE:                                                                                                                                                                                             SUBJECTIVE STATEMENT: Pt reports his test results came back clear. Still has a headache today. No falls. Worked on his side stepping and OH presses this weekend.   Pt accompanied by: self  PERTINENT HISTORY: CVA, ETOH abuse  PAIN:  Are you having pain? Yes: NPRS scale: 3/10 Pain location: Frontal head/eyes Pain description: Headache/pressure   PRECAUTIONS: Fall   PATIENT GOALS: "to return to work"  OBJECTIVE:  Note: Objective measures were completed at Evaluation unless otherwise  noted.  DIAGNOSTIC FINDINGS: 05/02/23 brain MRI IMPRESSION: Normal examination. No abnormality seen to explain the clinical presentation.  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 Pt reports no one has called to discuss his results but will call today and inquire about findings.  Assessed BP (see above) and WNL. Pt reports he is taking 5mg  of amlodipine rather than 2.5 mg now.    NMR  In hallway, performed agility drill w/cones placed 1' apart for improved high velocity movement, BLE strength and coordination. Pt performed well w/no LOB but became fatigued quickly. Pt reported increase in headache (4/10) following activity and required seated rest break to recover following activity.  Dibbling basketball w/RUE while weaving in between cones in fwd and retro direction, x10 reps, for improved UE and LE coordination, step clearance and return to sport. Pt knocking cones over 2x but otherwise no LOB or instability noted. Pt became frustrated with activity as he does not feel coordinated, so provided education and encouragement to regarding avoiding negative self-talk and focusing on resting and healing. Pt verbalized understanding and agreement.    PATIENT EDUCATION: Education details: continue HEP, see self-care section   Person educated: Patient Education method: Explanation and Handouts Education comprehension: verbalized understanding  HOME EXERCISE PROGRAM: Access Code: [email or web address] URL: [email or web address] Date: 05/17/2023 Prepared by: Merry Lofty  Exercises - Side Stepping with Resistance at Ankles  - 1 x daily - 7 x weekly - 3 sets - 10 reps - Forward Monster Walks  - 1 x daily - 7  x weekly - 3 sets - 10 reps - Backward Monster Walks  - 1 x daily - 7 x weekly - 3 sets - 10 reps - Runner's Step Up/Down  - 1 x daily - 7 x weekly - 3 sets - 10 reps - Lateral Step Ups  - 1 x daily - 7 x weekly - 3 sets - 10 reps - Push-Up on Counter  - 1 x daily - 7 x weekly - 3 sets - 10 reps - 1 second hold  GOALS: Goals reviewed with patient? Yes  SHORT TERM GOALS: = LTG based on PT POC length   LONG TERM GOALS: Target date: 06/11/23  Pt will be independent with final HEP for improved functional strength and mobility  Baseline: to be provided Goal status: INITIAL  2.  Pt will improve FGA to >/= 23/30 to demonstrate improved balance and reduced fall risk  Baseline: 18/30 Goal status: INITIAL  3.  Patient will negotiate at least 12 steps with no hand rails, reciprocal pattern, independently to demonstrate improved eccentric control of R knee Baseline: 4 steps step-to and required B HR Goal status: INITIAL  4.  Patient will lift at least 50 lbs from the ground to his waist to simulate work-related duties Baseline: unable Goal status: INITIAL    ASSESSMENT:  CLINICAL IMPRESSION: Emphasis of skilled PT session on pt education, high velocity movement and coordination. Pt reports he has not heard back from anyone regarding his venogram but is concerned about mention of venous anomaly, so encouraged pt to call neurologist. Pt continues to become frustrated with high-level coordination and velocity tasks but does not demonstrate instability. Pt fatigued very quickly this date but BP well-managed due to increase in amlodipine. Continue POC.    OBJECTIVE IMPAIRMENTS: Abnormal gait, decreased balance, decreased coordination, decreased knowledge of condition, decreased strength, impaired sensation, and impaired UE functional use.   ACTIVITY LIMITATIONS: carrying, lifting, bending, squatting, stairs, reach over head, locomotion level, and caring for others  PARTICIPATION LIMITATIONS:  meal prep, cleaning, interpersonal relationship, driving, shopping, community activity, occupation, and yard work  PERSONAL FACTORS: Age, Fitness, Profession, Time since onset of injury/illness/exacerbation, and Transportation are also affecting patient's functional outcome.   REHAB POTENTIAL: Good  CLINICAL DECISION MAKING: Stable/uncomplicated  EVALUATION COMPLEXITY: Low  PLAN:  PT FREQUENCY: 2x/week  PT DURATION: 4 weeks  PLANNED INTERVENTIONS: 97164- PT Re-evaluation, 97110-Therapeutic exercises, 97530- Therapeutic activity, 97112- Neuromuscular re-education, 97535- Self Care, 60454- Manual therapy, [phone]- Gait training, [phone]- Aquatic Therapy, Patient/Family education, Balance training, Stair training, Dry Needling, Vestibular training, Visual/preceptual remediation/compensation, and DME instructions  PLAN FOR NEXT SESSION: high level balance, R LE eccentric control, heavy lifting, CHECK BP, return to work tasks (overhead, climbing higher stairs), basketball    Lyle Niblett E Miciah Covelli, PT, DPT 05/31/2023, 9:32 AM

## 2023-05-31 NOTE — Patient Instructions (Signed)
What to do when you can't find your word(s)  Take a deep breath to remain calm Try to describe it  Use a different word that relays the same meaning Let your listener know you need help  Use gestures Try writing it Think through the alphabet to see if a letter sparks you thinking of the word  Describing words  What group does it belong to?  What do I use it for?  Where can I find it?  What does it LOOK like?  What other words go with it?  What is the 1st sound of the word?

## 2023-05-31 NOTE — Therapy (Signed)
 OUTPATIENT SPEECH LANGUAGE PATHOLOGY EVALUATION   Patient Name: Derrick Houston MRN: [id number] DOB:Sep 08, 1965, 58 y.o., male Today's Date: 05/31/2023  PCP: Barnie Mort PA-C REFERRING PROVIDER: Arma Heading (Doc sent to PCP)  END OF SESSION:  End of Session - 05/31/23 0848     Visit Number 1    Number of Visits 17    Date for SLP Re-Evaluation 07/26/23    Authorization Type UHC    SLP Start Time 0802    SLP Stop Time  0845    SLP Time Calculation (min) 43 min    Activity Tolerance Patient tolerated treatment well             Past Medical History:  Diagnosis Date   ETOH abuse    Stroke [facility])    History reviewed. No pertinent surgical history. There are no active problems to display for this patient.   ONSET DATE: 05/21/23   REFERRING DIAG: I63.9 (ICD-10-CM) - CVA (cerebral vascular accident)  THERAPY DIAG:  Aphasia  Dysarthria and anarthria  Rationale for Evaluation and Treatment: Rehabilitation  SUBJECTIVE:   SUBJECTIVE STATEMENT: Pt presents with mild anomia and dysarthria with intermittent imprecise articulation and slowed rate of speech compared to prior baseline. Pt aware of communication changes and reports frustration re: deviance from baseline.  Pt accompanied by: self  PERTINENT HISTORY:  CVA, ETOH abuse, marijuana use, HTN, headache, prediabetes   PAIN:  Are you having pain? Yes, slight headache  FALLS: Has patient fallen in last 6 months?  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  DIAGNOSTIC FINDINGS: 05/02/23 brain MRI Normal examination. No abnormality seen to explain the clinical presentation.  COGNITION: Overall cognitive status: Within  functional limits for tasks assessed  AUDITORY COMPREHENSION: Overall auditory comprehension: Appears intact YES/NO questions: Appears intact Following directions: Appears intact Conversation: Moderately Complex Interfering components: motor planning and processing speed Effective technique: extra processing time per patient report when conversing at home  EXPRESSION: verbal  VERBAL EXPRESSION: Level of generative/spontaneous verbalization: word, phrase, sentence, and conversation Automatic speech: name: intact and social response: intact  Repetition: Appears intact Naming: Confrontation: 76-100% Pragmatics: Appears intact Comments: Pt complains of slower rate of speech Effective technique: semantic cues and written cues Non-verbal means of communication: N/A  WRITTEN EXPRESSION: Dominant hand: right  MOTOR SPEECH: Overall motor speech: impaired Level of impairment: Sentence and Conversation Respiration: thoracic breathing Phonation: normal Resonance: WFL Articulation: Impaired: conversation Intelligibility: Intelligibility reduced Motor planning: Impaired: aware, slowed speech Motor speech errors: aware and inconsistent Effective technique: slow rate and over articulate  ORAL MOTOR EXAMINATION: Overall status: Impaired:   Lingual: Bilateral (ROM and Coordination) Comments: Reduced range of motion impacting speech  STANDARDIZED ASSESSMENTS: BNT: 59/60  PATIENT REPORTED OUTCOME MEASURES (PROM): Communication Participation Item Bank: 18  The Communicative Participation Item Bank        Does your condition interfere with... Pt Rating   ...talking with people you know 2   ...communicating when you need to say something quickly 2   ...talking with people you do not know 2   ...communicating when you are out in your community 2   ...asking questions in a conversation 2   ....communicating in a small group of people 2   .Marland KitchenMarland Kitchen  having a long conversation 2   ...giving  detailed infomrmation 2   ...getting your turn in a fast moving conversation 1   ...trying to persuade a friend or family member to see a different point of view 1  3= Not at all; 2=A little; 1=Quite a bit; 0=Very much                                                                                                                             TREATMENT DATE:  05/31/23: SLP initiated education and instruction of anomia strategies, with handout provided. Introduced Chartered loss adjuster for family to utilize at home versus providing targeted word. Pt verbalized understanding and agreement.    PATIENT EDUCATION: Education details: See above Person educated: Patient Education method: Explanation Education comprehension: verbalized understanding and needs further education   GOALS: Goals reviewed with patient? Yes  SHORT TERM GOALS: Target date: 06/28/2023  Pt will demonstrate word finding strategies for anomia with 80% accuracy during structured task given min A.   Baseline: Goal status: INITIAL  2.  Pt will demonstrate strategies for dysarthria with 80% accuracy during a structured task with min A. Baseline:  Goal status: INITIAL  3.  Pt will complete HEP 5/7 days over 2 week period  Baseline:  Goal status: INITIAL  4.  Pt will complete structured language tasks with 80% accuracy given min A  Baseline:  Goal status: INITIAL   LONG TERM GOALS: Target date: 07/26/2023  Pt will report improved communication effectiveness via PROM by 2 points by LTG date   Baseline: CPIB=18 Goal status: INITIAL  2. Pt will utilize anomia strategies in 80% of opportunities in conversation over 2 sessions with mod I  Baseline:  Goal status: INITIAL  3.  Pt will be independent with HEP and report consistent carryover of strategies. Baseline:  Goal status: INITIAL  4.  Pt will utilize dysarthria strategies to achieve clear, intelligible speech in 95% of opportunities over 2 sessions with mod  I Baseline:  Goal status: INITIAL   ASSESSMENT:  CLINICAL IMPRESSION: Patient is a 58 y.o. man who was seen today for mild anomia and dysarthria following a TIA/CVA. Pt presents with occasional word finding deficits scoring 59/60 on the [employer] (BNT) with intermittent extra time, however, pt reports he primarily experiences word retrieval difficulty when engaging in exciting/emotionally charged conversation at home, and reports this deficit impacts his communication on a regular basis. Pt also expressed concerns regarding "slow speech" and knowing what he wants to communicate, but feeling unable to say it with the rate of speech he maintained prior to the stroke. Pt's speech is characterized by imprecise articulation of words at the sentence and conversational level, and reduced rate of speech. This pt would benefit from skilled ST to address the aforementioned deficits and improve pt's functional communication, speech and QoL.  OBJECTIVE IMPAIRMENTS: include aphasia and dysarthria These impairments are limiting patient from effectively communicating at home and  in community. Factors affecting potential to achieve goals and functional outcome are  none . Patient will benefit from skilled SLP services to address above impairments and improve overall function.  REHAB POTENTIAL: Good  PLAN:  SLP FREQUENCY: 2x/week  SLP DURATION: 8 weeks  PLANNED INTERVENTIONS: Language facilitation, Environmental controls, Cueing hierachy, Internal/external aids, Functional tasks, Multimodal communication approach, SLP instruction and feedback, Compensatory strategies, Patient/family education, [phone] Treatment of speech (30 or 45 min) , and 60454- Speech Eval Sound Prod, Artic, Phon, Eval Compre, Express   Evaluation and documentation in part completed by graduate clinician, Debby Bud, and supervised by Zena Amos.    Gracy Racer, CCC-SLP 05/31/2023, 2:39 PM

## 2023-05-31 NOTE — Patient Instructions (Signed)
    Keeping Thinking Skills Sharp: 1. Jigsaw puzzles 2. Card/board games 3. Talking on the phone/social events 4. [email or web address] 5. Online games 6. Word searches/crossword puzzles 7.  Logic puzzles 8. Aerobic exercise (stationary bike) 9. Eating balanced diet (fruits & veggies) 10. Drink water 11. Try something new--new recipe, hobby 12. Crafts 13. Do a variety of activities that are challenging 14.  Plan weekly meals and write a grocery list 15. Add cognitive activities to walking/exercising (think of animal/food/city with each letter of the alphabet, counting backwards, thinking of as many vegetables as you can, etc.).--Only do this  If safe (no freezing/falls)

## 2023-05-31 NOTE — Therapy (Signed)
 OUTPATIENT OCCUPATIONAL THERAPY NEURO Treatment  Patient Name: Derrick Houston MRN: [id number] DOB:05-May-1965, 58 y.o., male Today's Date: 05/31/2023  PCP: Barnie Mort, PA-C  REFERRING PROVIDER: Arma Heading   END OF SESSION:  OT End of Session - 05/31/23 1113     Visit Number 2    Number of Visits 13    Date for OT Re-Evaluation 07/23/23    Authorization Type UHC 2025, no auth required, VL: 90 combined    OT Start Time 0930    OT Stop Time 1008    OT Time Calculation (min) 38 min    Activity Tolerance Patient tolerated treatment well    Behavior During Therapy WFL for tasks assessed/performed              Past Medical History:  Diagnosis Date   ETOH abuse    Stroke (HCC)    No past surgical history on file. There are no active problems to display for this patient.   ONSET DATE: 05/21/23 (referral date)  REFERRING DIAG: I63.9 (ICD-10-CM) - Impending cerebrovascular accident (HCC)   THERAPY DIAG:  Muscle weakness (generalized)  Other lack of coordination  Other symptoms and signs involving the musculoskeletal system  Other symptoms and signs involving the nervous system  Rationale for Evaluation and Treatment: Rehabilitation  SUBJECTIVE:   SUBJECTIVE STATEMENT: Pt reported slight headache "as usual." Pt reported difficulty with coordination for basketball tasks in PT session today.  Pt accompanied by: self  PERTINENT HISTORY: CVA, ETOH abuse, marijuana use, HTN, headache, prediabetes  05/02/23 brain MRI IMPRESSION: Normal examination. No abnormality seen to explain the clinical presentation.  PRECAUTIONS: Fall  WEIGHT BEARING RESTRICTIONS: No  PAIN:  Are you having pain? 3-4/10 "slight headache"  FALLS: Has patient fallen in last 6 months? No  LIVING ENVIRONMENT:  Lives with: lives with their family Lives in: House/apartment Stairs: Yes: Internal: 2 steps; on right going up Has following equipment at home: Single point cane and  Grab bars  PLOF: ind, Occupation: carpentry, currently driving  PATIENT GOALS: "to get back to work"  OBJECTIVE:  Note: Objective measures were completed at Evaluation unless otherwise noted.  HAND DOMINANCE: Right  ADLs: Overall ADLs: ind Transfers/ambulation related to ADLs: ind Eating: ind, including cutting with knife Grooming: ind UB Dressing: ind, no difficulty zippers/buttons LB Dressing: ind with extra time, no difficulty tying shoes Toileting: ind Bathing: ind, some difficulty reaching behind back with R hand though can manage with extra time Tub Shower transfers: ind Equipment: Grab bars and Walk in shower  IADLs: Shopping: ind, some assistance from family, some difficulty carrying heavy cases of water d/t weakness of R side Light housekeeping: ind vacuuming, laundry, dishwashing, sweeping - some fatigue with sweeping and vacuuming "I don't have the stamina I used to have." Meal Prep: often orders fast food currently, different from before the stroke. Pt reported increased difficulty with meal prep. Community mobility: currently driving, some help from family for errands Medication management: 05/31/23 update - Pt takes medication ind as prescribed, daughter helps retrieve medication from pharmacy.  Handwriting:  Pt reported slower speed of handwriting.  Work-related tasks: Pt reported unable to complete work-related carpentry tasks, such as difficulty driving nail using hammer with RUE.  MOBILITY STATUS: Independent  POSTURE COMMENTS:  Ind sitting balance  ACTIVITY TOLERANCE: Activity tolerance: increased fatigue during the day. "I don't have the stamina I used to have."  FUNCTIONAL OUTCOME MEASURES: Quick Dash: 36.4% deficit    UPPER EXTREMITY ROM:  Active ROM Right eval Left eval  Shoulder flexion WFL with extra time [facility]  Shoulder abduction WFL with extra time, v/c to breathe d/t pt holding breath WFL  Shoulder adduction WFL with extra time [facility]   Shoulder extension    Shoulder internal rotation    Shoulder external rotation    Elbow flexion    Elbow extension    Wrist flexion    Wrist extension    Wrist ulnar deviation    Wrist radial deviation    Wrist pronation    Wrist supination    (Blank rows = not tested)  UPPER EXTREMITY MMT:     MMT Right eval Left eval  Shoulder flexion 3+ 5  Shoulder abduction    Shoulder adduction    Shoulder extension    Shoulder internal rotation    Shoulder external rotation    Middle trapezius    Lower trapezius    Elbow flexion    Elbow extension    Wrist flexion    Wrist extension    Wrist ulnar deviation    Wrist radial deviation    Wrist pronation    Wrist supination    (Blank rows = not tested)  HAND FUNCTION: Grip strength: Right: 40, 29, 33 (34 lbs average) lbs; Left: 68, 71, 74 (71 lbs average) lbs  Pt benefited from v/c to breathe during task d/t pt tending to hold breath during task.  COORDINATION: 9 Hole Peg test: Right: 24 sec; Left: 23 sec  Pt reported coordination is "off a little bit." Pt reported difficulty using TV remote. Pt reported dropping phone several times this week.  SENSATION: Pt denied tingling/numbness in BUE. Pt reported tingling in R foot "front half towards the toes. "  EDEMA: none noted  MUSCLE TONE: RUE: Within functional limits and LUE: Within functional limits  COGNITION: Overall cognitive status: Within functional limits for tasks assessed  Pt reported some difficulty remembering all seasoning and other ingredients when preparing a meal though recalls ingredients with extra time. Pt reported "I can't get my words and everything to line up." Pt reported some difficulty with word-finding. Pt reported some very mild slurred speech later in the evening. Pt reported difficulties with short-term memory.  OT noted slightly increased time for pt to respond though pt answered questions appropriately.  Sunshine, pencil, bed: Pt recalled 3/3  words after approx. 3 minutes.  VISION: Subjective report: Pt reported lights "mess with my eyes at night" when driving and when riding in vehicle. Pt accurately read clock on wall. Baseline vision: Wears glasses for reading only Visual history:  none noted  VISION ASSESSMENT: Tracking: Visual tracking to all 4 quadrants, no concerns noted.  PERCEPTION: Not tested  PRAXIS: Not tested  OBSERVATIONS: Pt ambulated ind without A/E. Pt was pleasant and appeared well-kept.  TREATMENT DATE:    Self-Care PT updated OT that pt's BP was stable therefore tasks continued. Pt denied symptoms of dizziness/lightheadedness. OT educated pt on BP management, diet, and hydration. Pt verbalized understanding.  OT educated pt on memory compensation strategies and activities to keep thinking skills sharp. Handout provided, see pt instructions. Pt acknowledged understanding of all.  TherAct HEP update: OT initiated green theraputty HEP to improve FM coordination and strengthening of affected UE. Pt returned demo of exercises: Access Code: [email or web address] URL: [email or web address] Date: 05/31/2023 Prepared by: Carilyn Goodpasture  Exercises (at least 2x per day, increase to 3x per day as tolerated and as schedule/routine allows) - Putty Squeezes  - 3 x daily - 1 sets - 10 reps - Rolling Putty on Table  - 3 x daily - 1 sets - 10 reps - Tip Pinch with Putty  - 3 x daily - 1 sets - 10 reps - Seated Three Finger Pinch with Putty  - 3 x daily - 1 sets - 10 reps - Finger Key Grip with Putty  - 3 x daily - 1 sets - 10 reps - Finger Extension with Putty  - 3 x daily - 1 sets - 10 reps - Marble / golf tee Pick Up from Putty  - 3 x daily - 1 sets - 10 reps  PATIENT EDUCATION: Education details: see today's tx above Person educated: Patient Education method: Explanation, Demonstration,  and Handouts Education comprehension: verbalized understanding and returned demonstration  HOME EXERCISE PROGRAM: 05/31/23 - Memory compensation strategies, Activities to keep thinking skills sharp (handouts, see pt instructions), green theraputty Access Code: [email or web address]   GOALS: Goals reviewed with patient? Yes  SHORT TERM GOALS: Target date: 06/25/23  Patient will demonstrate initial RUE HEP with visual handouts and 25% verbal cues or less for proper execution.  Baseline: new to outpt OT Goal status: in progress  2.  Pt will recall or demo at least 3 memory compensation strategies. Baseline: Pt reported mild difficulty with word-finding and short-term memory recall. Goal status: in progress  3.  Pt will demonstrate ability to retrieve a 3 lbs object from overhead shelf at 115*with RUE, demonstrating good control.  Baseline: Pt reported and demo'd decreased RUE grip strength, extra time to achieve full shoulder ROM of RUE. Goal status: INITIAL  4.  Pt will report improved efficiency and decreased fatigue when completing ADL LB dressing and IADL household management tasks, indicating improved activity tolerance.  Baseline: Pt reported extra time required for LB dressing and increased fatigue with IADL household management tasks. Goal status: INITIAL  LONG TERM GOALS: Target date: 07/23/23  Patient will ind demonstrate updated RUE HEP with visual handouts. Baseline: new to outpt OT Goal status: INITIAL  2.  Pt will demo at least 50 lbs (average of 3 trials) RUE grip strength as needed to carpentry tasks. Baseline: Grip strength: Right: 40, 29, 33 (34 lbs average) lbs; Left: 68, 71, 74 (71 lbs average) lbs Goal status: in progress  3.  Patient will demo improved FM coordination as evidenced by completing nine-hole peg with use of RUE in 22 seconds or less.  Baseline: 9 Hole Peg test: Right: 24 sec; Left: 23 sec Goal status: INITIAL  4.  Patient will demonstrate at least 16%  improvement with quick Dash score (reporting 20.4% disability or less) indicating improved functional use of affected extremity.   Baseline: 36.4% deficit Goal status: INITIAL  5.  Pt will ind complete simple simulated medication management task with 100%  accuracy.  Baseline: Pt's daughter assists pt with managing medications. Goal status: INITIAL  6.  Pt will report no more than moderate difficulty with work-related carpentry tasks per QuickDASH. Baseline: Pt reported unable to complete carpentry tasks for work per [employer]. Goal status: INITIAL  ASSESSMENT:  CLINICAL IMPRESSION: Pt tolerated tasks well, reporting some fatigue of affected hand after exercises secondary to decreased strength of affected UE. Pt denied increase in pain/discomfort of affected UE. Pt would benefit from skilled OT services in the outpatient setting to work on impairments as noted below to help pt return to PLOF as able.     PERFORMANCE DEFICITS: in functional skills including ADLs, IADLs, coordination, dexterity, proprioception, ROM, strength, flexibility, Fine motor control, Gross motor control, mobility, balance, body mechanics, endurance, vision, and UE functional use, cognitive skills including attention, energy/drive, and memory, and psychosocial skills including environmental adaptation.   IMPAIRMENTS: are limiting patient from ADLs, IADLs, rest and sleep, work, leisure, and social participation.   CO-MORBIDITIES: may have co-morbidities  that affects occupational performance. Patient will benefit from skilled OT to address above impairments and improve overall function.  MODIFICATION OR ASSISTANCE TO COMPLETE EVALUATION: No modification of tasks or assist necessary to complete an evaluation.  OT OCCUPATIONAL PROFILE AND HISTORY: Detailed assessment: Review of records and additional review of physical, cognitive, psychosocial history related to current functional performance.  CLINICAL DECISION MAKING:  Moderate - several treatment options, min-mod task modification necessary  REHAB POTENTIAL: Good  EVALUATION COMPLEXITY: Moderate    PLAN:  OT FREQUENCY: 2x/week  OT DURATION: 6 weeks (dates extended to allow for scheduling)  PLANNED INTERVENTIONS: 16109 OT Re-evaluation, 97535 self care/ADL training, 60454 therapeutic exercise, 97530 therapeutic activity, 97112 neuromuscular re-education, 97140 manual therapy, 97035 ultrasound, 97018 paraffin, 09811 fluidotherapy, 97010 moist heat, 97010 cryotherapy, 97760 Orthotics management and training, 91478 Splinting (initial encounter), [id number] Subsequent splinting/medication, passive range of motion, functional mobility training, visual/perceptual remediation/compensation, energy conservation, patient/family education, and DME and/or AE instructions  RECOMMENDED OTHER SERVICES: PT and SLP evals completed  CONSULTED AND AGREED WITH PLAN OF CARE: Patient  PLAN FOR NEXT SESSION:  Monitor BP Shoulder IR ROM with cane + Theraband HEP - red or green FM coordination HEP FM coordination activities and general strengthening of affected UE (e.g. Blaze Pods, Edwina Barth, etc.)  Review Theraputty HEP PRN  Wynetta Emery, OT 05/31/2023, 11:17 AM

## 2023-06-03 ENCOUNTER — Ambulatory Visit: Payer: 59 | Admitting: Occupational Therapy

## 2023-06-03 ENCOUNTER — Ambulatory Visit: Payer: 59

## 2023-06-07 ENCOUNTER — Ambulatory Visit: Payer: 59 | Admitting: Physical Therapy

## 2023-06-07 VITALS — BP 137/85 | HR 79

## 2023-06-07 DIAGNOSIS — R2689 Other abnormalities of gait and mobility: Secondary | ICD-10-CM

## 2023-06-07 DIAGNOSIS — M6281 Muscle weakness (generalized): Secondary | ICD-10-CM | POA: Diagnosis not present

## 2023-06-07 DIAGNOSIS — R2681 Unsteadiness on feet: Secondary | ICD-10-CM

## 2023-06-07 NOTE — Therapy (Signed)
 OUTPATIENT PHYSICAL THERAPY NEURO TREATMENT   Patient Name: Derrick Houston MRN: [id number] DOB:06/24/1965, 58 y.o., male Today's Date: 06/07/2023   PCP: Warnell Forester, PA-C REFERRING PROVIDER: Warnell Forester, PA-C  END OF SESSION:  PT End of Session - 06/07/23 0849     Visit Number 7    Number of Visits 9    Date for PT Re-Evaluation 06/11/23    Authorization Type UHC    PT Start Time 0847    PT Stop Time 0932    PT Time Calculation (min) 45 min    Equipment Utilized During Treatment --    Activity Tolerance Patient tolerated treatment well;Other (comment)   Headache   Behavior During Therapy WFL for tasks assessed/performed                Past Medical History:  Diagnosis Date   ETOH abuse    Stroke (HCC)    No past surgical history on file. There are no active problems to display for this patient.   ONSET DATE: 05/11/23 referral  REFERRING DIAG:  G29.52 (ICD-10-CM) - Personal history of transient ischemic attack (TIA), and cerebral infarction without residual deficits  R53.1 (ICD-10-CM) - Weakness of right side of body    THERAPY DIAG:  Other abnormalities of gait and mobility  Muscle weakness (generalized)  Unsteadiness on feet  Rationale for Evaluation and Treatment: Rehabilitation  SUBJECTIVE:                                                                                                                                                                                             SUBJECTIVE STATEMENT: Pt reports he has a headache today, rating it as a 5/10. Sees the neurologist on Wednesday. Tried to resume painting his bedroom last week and had a really hard time standing on a step stool and painting with his R hand. States he was exhausted afterwards.   Pt accompanied by: self  PERTINENT HISTORY: CVA, ETOH abuse  PAIN:  Are you having pain? Yes: NPRS scale: 5/10 Pain location: Frontal head/eyes Pain description: Headache/pressure    PRECAUTIONS: Fall   PATIENT GOALS: "to return to work"  OBJECTIVE:  Note: Objective measures were completed at Evaluation unless otherwise noted.  DIAGNOSTIC FINDINGS: 05/02/23 brain MRI IMPRESSION: Normal examination. No abnormality seen to explain the clinical presentation.  VITALS  Vitals:   06/07/23 0912  BP: 137/85  Pulse: 79  TREATMENT: Self-care/home management  Discussed difficulties pt had with standing on step stool and painting room. Pt reports he realizes he is not ready to return to work as he cannot safely perform his duties. Encouraged pt to speak to neurologist about this. Educated pt on importance of rest and recovery and increased risk of injury if he over exerts himself. Pt verbalized agreement and understanding.  Assessed vitals (see above) and WNL  Discussed plan to add 4 more weeks of PT to work on high level balance tasks and holding power tools. Encouraged pt to bring power tools to session as well.    NMR  In // bars for improved ankle strategy, single leg stability, reaching out of BOS and imitating standing on step stool:  Alt fwd eccentric heel taps from 8" step w/BUE support, x7 reps per side. Increased difficulty performing on LLE due to decreased stability on RLE.  Standing on 10" height (6" box and slender 4" box stacked) to imitate step stool, x3 minutes w/intermittent UE support. Pt unable to stabilize without UE support and frequently losing balance posteriorly. Increased ankle instability of RLE throughout. Cued pt for wider BOS and pt more stable w/this.  Progressed to standing on 4" slender box only while holding 10# KB handle-down, x3 minutes. Pt very challenged by this and required max verbal cues to maintain breathing. Progressed to [facility] presses w/KB handle-down x7 reps per side. Pt stable if standing w/wide BOS but  unable to perform if feet hip-width apart.  Straight leg deadlifts from 4" slender box w/10# KB, x12 reps. Pt able to perform well w/no LOB if standing w/wide BOS.    PATIENT EDUCATION: Education details: continue HEP, updates to POC   Person educated: Patient Education method: Explanation Education comprehension: verbalized understanding  HOME EXERCISE PROGRAM: Access Code: [email or web address] URL: [email or web address] Date: 05/17/2023 Prepared by: Merry Lofty  Exercises - Side Stepping with Resistance at Ankles  - 1 x daily - 7 x weekly - 3 sets - 10 reps - Forward Monster Walks  - 1 x daily - 7 x weekly - 3 sets - 10 reps - Backward Monster Walks  - 1 x daily - 7 x weekly - 3 sets - 10 reps - Runner's Step Up/Down  - 1 x daily - 7 x weekly - 3 sets - 10 reps - Lateral Step Ups  - 1 x daily - 7 x weekly - 3 sets - 10 reps - Push-Up on Counter  - 1 x daily - 7 x weekly - 3 sets - 10 reps - 1 second hold  GOALS: Goals reviewed with patient? Yes  SHORT TERM GOALS: = LTG based on PT POC length   LONG TERM GOALS: Target date: 06/11/23  Pt will be independent with final HEP for improved functional strength and mobility  Baseline: to be provided Goal status: INITIAL  2.  Pt will improve FGA to >/= 23/30 to demonstrate improved balance and reduced fall risk  Baseline: 18/30 Goal status: INITIAL  3.  Patient will negotiate at least 12 steps with no hand rails, reciprocal pattern, independently to demonstrate improved eccentric control of R knee Baseline: 4 steps step-to and required B HR Goal status: INITIAL  4.  Patient will lift at least 50 lbs from the ground to his waist to simulate work-related duties Baseline: unable Goal status: INITIAL    ASSESSMENT:  CLINICAL IMPRESSION: Emphasis of skilled PT session on pt education, balance w/narrow BOS and RUE stability w/OH reach. Pt  reports he understands he cannot return to work yet due to inability to paint his  bedroom over the weekend and difficulty to stabilize on step stool. Pt in agreement to recert and add 2x/week for 4 weeks to continue working on high level balance tasks and holding power tools. Continue POC.    OBJECTIVE IMPAIRMENTS: Abnormal gait, decreased balance, decreased coordination, decreased knowledge of condition, decreased strength, impaired sensation, and impaired UE functional use.   ACTIVITY LIMITATIONS: carrying, lifting, bending, squatting, stairs, reach over head, locomotion level, and caring for others  PARTICIPATION LIMITATIONS: meal prep, cleaning, interpersonal relationship, driving, shopping, community activity, occupation, and yard work  PERSONAL FACTORS: Age, Fitness, Profession, Time since onset of injury/illness/exacerbation, and Transportation are also affecting patient's functional outcome.   REHAB POTENTIAL: Good  CLINICAL DECISION MAKING: Stable/uncomplicated  EVALUATION COMPLEXITY: Low  PLAN:  PT FREQUENCY: 2x/week  PT DURATION: 4 weeks  PLANNED INTERVENTIONS: 97164- PT Re-evaluation, 97110-Therapeutic exercises, 97530- Therapeutic activity, 97112- Neuromuscular re-education, 97535- Self Care, 16109- Manual therapy, [phone]- Gait training, [phone]- Aquatic Therapy, Patient/Family education, Balance training, Stair training, Dry Needling, Vestibular training, Visual/preceptual remediation/compensation, and DME instructions  PLAN FOR NEXT SESSION: Goals and recert. high level balance, R LE eccentric control, heavy lifting, CHECK BP, return to work tasks (overhead, climbing higher stairs), basketball, work w/power tools and imitate ladders/step stools    Jerre Diguglielmo E Adaora Mchaney, PT, DPT 06/07/2023, 9:42 AM

## 2023-06-08 ENCOUNTER — Ambulatory Visit: Payer: 59 | Admitting: Occupational Therapy

## 2023-06-08 ENCOUNTER — Ambulatory Visit: Payer: 59 | Admitting: Speech Pathology

## 2023-06-08 VITALS — BP 121/79 | HR 75

## 2023-06-08 DIAGNOSIS — M6281 Muscle weakness (generalized): Secondary | ICD-10-CM

## 2023-06-08 DIAGNOSIS — R471 Dysarthria and anarthria: Secondary | ICD-10-CM

## 2023-06-08 DIAGNOSIS — R278 Other lack of coordination: Secondary | ICD-10-CM

## 2023-06-08 DIAGNOSIS — R4701 Aphasia: Secondary | ICD-10-CM

## 2023-06-08 DIAGNOSIS — R29818 Other symptoms and signs involving the nervous system: Secondary | ICD-10-CM

## 2023-06-08 DIAGNOSIS — R29898 Other symptoms and signs involving the musculoskeletal system: Secondary | ICD-10-CM

## 2023-06-08 NOTE — Therapy (Signed)
 OUTPATIENT SPEECH LANGUAGE PATHOLOGY TREATMENT   Patient Name: Derrick Houston MRN: [id number] DOB:1966/02/18, 58 y.o., male Today's Date: 06/08/2023  PCP: Barnie Mort PA-C REFERRING PROVIDER: Arma Heading (Doc sent to PCP)  END OF SESSION:  End of Session - 06/08/23 0847     Visit Number 2    Number of Visits 17    Date for SLP Re-Evaluation 07/26/23    Authorization Type UHC    SLP Start Time 0847    SLP Stop Time  0930    SLP Time Calculation (min) 43 min    Activity Tolerance Patient tolerated treatment well;Other (comment)              Past Medical History:  Diagnosis Date   ETOH abuse    Stroke (HCC)    No past surgical history on file. There are no active problems to display for this patient.   ONSET DATE: 05/21/23   REFERRING DIAG: I63.9 (ICD-10-CM) - CVA (cerebral vascular accident)  THERAPY DIAG:  Aphasia  Dysarthria and anarthria  Rationale for Evaluation and Treatment: Rehabilitation  SUBJECTIVE:   SUBJECTIVE STATEMENT: Pt reported reminding family members not to "finish his sentences" per discussion at initial evaluation involving cuing hierarchy.  Pt accompanied by: self  PERTINENT HISTORY:  CVA, ETOH abuse, marijuana use, HTN, headache, prediabetes   PAIN:  Are you having pain? Yes, slight headache  FALLS: Has patient fallen in last 6 months?  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  DIAGNOSTIC FINDINGS: 05/02/23 brain MRI Normal examination. No abnormality seen to explain the clinical presentation.  COGNITION: Overall cognitive status: Within functional limits for tasks assessed  AUDITORY COMPREHENSION: Overall auditory comprehension:  Appears intact YES/NO questions: Appears intact Following directions: Appears intact Conversation: Moderately Complex Interfering components: motor planning and processing speed Effective technique: extra processing time per patient report when conversing at home  EXPRESSION: verbal  VERBAL EXPRESSION: Level of generative/spontaneous verbalization: word, phrase, sentence, and conversation Automatic speech: name: intact and social response: intact  Repetition: Appears intact Naming: Confrontation: 76-100% Pragmatics: Appears intact Comments: Pt complains of slower rate of speech Effective technique: semantic cues and written cues Non-verbal means of communication: N/A  WRITTEN EXPRESSION: Dominant hand: right  MOTOR SPEECH: Overall motor speech: impaired Level of impairment: Sentence and Conversation Respiration: thoracic breathing Phonation: normal Resonance: WFL Articulation: Impaired: conversation Intelligibility: Intelligibility reduced Motor planning: Impaired: aware, slowed speech Motor speech errors: aware and inconsistent Effective technique: slow rate and over articulate  ORAL MOTOR EXAMINATION: Overall status: Impaired:   Lingual: Bilateral (ROM and Coordination) Comments: Reduced range of motion impacting speech  STANDARDIZED ASSESSMENTS: BNT: 59/60  PATIENT REPORTED OUTCOME MEASURES (PROM): Communication Participation Item Bank: 18  The Communicative Participation Item Bank        Does your condition interfere with... Pt Rating   ...talking with people you know 2   ...communicating when you need to say something quickly 2   ...talking with people you do not know 2   ...communicating when you are out in your community 2   ...asking questions in a conversation 2   ....communicating in a small group of people 2   ...having a long conversation 2   ...giving detailed infomrmation 2   .Marland KitchenMarland Kitchen  getting your turn in a fast moving conversation 1   ...trying to  persuade a friend or family member to see a different point of view 1  3= Not at all; 2=A little; 1=Quite a bit; 0=Very much                                                                                                                             TREATMENT DATE:  06/08/23: SLP initiated pt education of dysarthria strategies. Pt confirming understanding and planning to exercise in conversation at home.  SLP implemented category naming language task prompting pt to implement dysarthria strategies throughout task and in side conversation to target anomia and cohesive, clear speech. Pt periodically used successfully to improve intelligibility with min-A. Pt reports his speech is most unintelligible when he is tired, or in the evening. SLP suggesting consistent pt practice using dysarthria techniques and implementing strategies during these moments in conversation. SLP then initiated language task teaching semantic feature analysis (SFA) with pt participating effectively given min-A. D/t pt reports of increased difficulty with anomia in debates with family members and or when excited, SLP plans to implement language tasks mirroring scenarios impacting pt with increased complexity to continue targeting anomia and dysarthric speech.  05/31/23: SLP initiated education and instruction of anomia strategies, with handout provided. Introduced Chartered loss adjuster for family to utilize at home versus providing targeted word. Pt verbalized understanding and agreement.    PATIENT EDUCATION: Education details: See above Person educated: Patient Education method: Explanation Education comprehension: verbalized understanding and needs further education   GOALS: Goals reviewed with patient? Yes  SHORT TERM GOALS: Target date: 06/28/2023  Pt will demonstrate word finding strategies for anomia with 80% accuracy during structured task given min A.   Baseline: Goal status: INITIAL  2.  Pt will demonstrate strategies  for dysarthria with 80% accuracy during a structured task with min A. Baseline:  Goal status: INITIAL  3.  Pt will complete HEP 5/7 days over 2 week period  Baseline:  Goal status: INITIAL  4.  Pt will complete structured language tasks with 80% accuracy given min A  Baseline:  Goal status: INITIAL   LONG TERM GOALS: Target date: 07/26/2023  Pt will report improved communication effectiveness via PROM by 2 points by LTG date   Baseline: CPIB=18 Goal status: INITIAL  2. Pt will utilize anomia strategies in 80% of opportunities in conversation over 2 sessions with mod I  Baseline:  Goal status: INITIAL  3.  Pt will be independent with HEP and report consistent carryover of strategies. Baseline:  Goal status: INITIAL  4.  Pt will utilize dysarthria strategies to achieve clear, intelligible speech in 95% of opportunities over 2 sessions with mod I Baseline:  Goal status: INITIAL   ASSESSMENT:  CLINICAL IMPRESSION: Patient is a 58 y.o. man who was seen today for mild anomia and dysarthria following a TIA/CVA. Pt presents with occasional word finding deficits scoring 59/60 on  the [employer] (BNT) with intermittent extra time, however, pt reports he primarily experiences word retrieval difficulty when engaging in exciting/emotionally charged conversation at home, and reports this deficit impacts his communication on a regular basis. Pt also expressed concerns regarding "slow speech" and knowing what he wants to communicate, but feeling unable to say it with the rate of speech he maintained prior to the stroke. Pt's speech is characterized by imprecise articulation of words at the sentence and conversational level, and reduced rate of speech. Pt continues to benefit from skilled ST to address the aforementioned deficits and improve pt's functional communication, speech and QoL.  OBJECTIVE IMPAIRMENTS: include aphasia and dysarthria These impairments are limiting patient from  effectively communicating at home and in community. Factors affecting potential to achieve goals and functional outcome are  none . Patient will benefit from skilled SLP services to address above impairments and improve overall function.  REHAB POTENTIAL: Good  PLAN:  SLP FREQUENCY: 2x/week  SLP DURATION: 8 weeks  PLANNED INTERVENTIONS: Language facilitation, Environmental controls, Cueing hierachy, Internal/external aids, Functional tasks, Multimodal communication approach, SLP instruction and feedback, Compensatory strategies, Patient/family education, [phone] Treatment of speech (30 or 45 min) , and 60454- Speech Eval Sound Prod, Artic, Phon, Eval Compre, Express   Evaluation and documentation in part completed by graduate clinician, Debby Bud, and supervised by Zena Amos.    [employer], Student-SLP 06/08/2023, 8:47 AM

## 2023-06-08 NOTE — Therapy (Signed)
 OUTPATIENT OCCUPATIONAL THERAPY NEURO Treatment  Patient Name: Derrick Houston MRN: [id number] DOB:02/21/1966, 58 y.o., male Today's Date: 06/08/2023  PCP: Modesta Messing  REFERRING PROVIDER: Arma Heading   END OF SESSION:  OT End of Session - 06/08/23 1217     Visit Number 3    Number of Visits 13    Date for OT Re-Evaluation 07/23/23    Authorization Type UHC 2025, no auth required, VL: 90 combined    OT Start Time 0933    OT Stop Time 1011    OT Time Calculation (min) 38 min    Activity Tolerance Patient tolerated treatment well    Behavior During Therapy WFL for tasks assessed/performed               Past Medical History:  Diagnosis Date   ETOH abuse    Stroke (HCC)    No past surgical history on file. There are no active problems to display for this patient.   ONSET DATE: 05/21/23 (referral date)  REFERRING DIAG: I63.9 (ICD-10-CM) - Impending cerebrovascular accident (HCC)   THERAPY DIAG:  Muscle weakness (generalized)  Other lack of coordination  Other symptoms and signs involving the musculoskeletal system  Other symptoms and signs involving the nervous system  Rationale for Evaluation and Treatment: Rehabilitation  SUBJECTIVE:   SUBJECTIVE STATEMENT: Pt reported increased fatigue when completing a home management task of painting a wall over the weekend. Pt reported exhaustion and sore several days after task. Pt reported "I realized I had done way too much." Pt reported "I understand my body is still not healed and I'm not ready to get back on ladders and work overhead."  Pt accompanied by: self  PERTINENT HISTORY: CVA, ETOH abuse, marijuana use, HTN, headache, prediabetes  05/02/23 brain MRI IMPRESSION: Normal examination. No abnormality seen to explain the clinical presentation.  PRECAUTIONS: Fall  WEIGHT BEARING RESTRICTIONS: No  PAIN:  Are you having pain? No  FALLS: Has patient fallen in last 6 months? No,    06/08/23 update: Pt reported near-fall on Friday 06/04/23 when painting a wall in home while standing on a stepstool, pt reported off-balance though recovered ind. Pt reported completing task though "it was tough."  LIVING ENVIRONMENT:  Lives with: lives with their family Lives in: House/apartment Stairs: Yes: Internal: 2 steps; on right going up Has following equipment at home: Single point cane and Grab bars  PLOF: ind, Occupation: carpentry, currently driving  PATIENT GOALS: "to get back to work"  OBJECTIVE:  Note: Objective measures were completed at Evaluation unless otherwise noted.  HAND DOMINANCE: Right  ADLs: Overall ADLs: ind Transfers/ambulation related to ADLs: ind Eating: ind, including cutting with knife Grooming: ind UB Dressing: ind, no difficulty zippers/buttons LB Dressing: ind with extra time, no difficulty tying shoes Toileting: ind Bathing: ind, some difficulty reaching behind back with R hand though can manage with extra time Tub Shower transfers: ind Equipment: Grab bars and Walk in shower  IADLs: Shopping: ind, some assistance from family, some difficulty carrying heavy cases of water d/t weakness of R side Light housekeeping: ind vacuuming, laundry, dishwashing, sweeping - some fatigue with sweeping and vacuuming "I don't have the stamina I used to have." Meal Prep: often orders fast food currently, different from before the stroke. Pt reported increased difficulty with meal prep. Community mobility: currently driving, some help from family for errands Medication management: 05/31/23 update - Pt takes medication ind as prescribed, daughter helps retrieve medication from pharmacy.  Handwriting:  Pt reported slower speed of handwriting.  Work-related tasks: Pt reported unable to complete work-related carpentry tasks, such as difficulty driving nail using hammer with RUE.  MOBILITY STATUS: Independent  POSTURE COMMENTS:  Ind sitting  balance  ACTIVITY TOLERANCE: Activity tolerance: increased fatigue during the day. "I don't have the stamina I used to have."  FUNCTIONAL OUTCOME MEASURES: Quick Dash: 36.4% deficit    UPPER EXTREMITY ROM:    Active ROM Right eval Left eval  Shoulder flexion WFL with extra time [facility]  Shoulder abduction WFL with extra time, v/c to breathe d/t pt holding breath WFL  Shoulder adduction WFL with extra time [facility]  Shoulder extension    Shoulder internal rotation    Shoulder external rotation    Elbow flexion    Elbow extension    Wrist flexion    Wrist extension    Wrist ulnar deviation    Wrist radial deviation    Wrist pronation    Wrist supination    (Blank rows = not tested)  UPPER EXTREMITY MMT:     MMT Right eval Left eval  Shoulder flexion 3+ 5  Shoulder abduction    Shoulder adduction    Shoulder extension    Shoulder internal rotation    Shoulder external rotation    Middle trapezius    Lower trapezius    Elbow flexion    Elbow extension    Wrist flexion    Wrist extension    Wrist ulnar deviation    Wrist radial deviation    Wrist pronation    Wrist supination    (Blank rows = not tested)  HAND FUNCTION: Grip strength: Right: 40, 29, 33 (34 lbs average) lbs; Left: 68, 71, 74 (71 lbs average) lbs  Pt benefited from v/c to breathe during task d/t pt tending to hold breath during task.  COORDINATION: 9 Hole Peg test: Right: 24 sec; Left: 23 sec  Pt reported coordination is "off a little bit." Pt reported difficulty using TV remote. Pt reported dropping phone several times this week.  SENSATION: Pt denied tingling/numbness in BUE. Pt reported tingling in R foot "front half towards the toes. "  EDEMA: none noted  MUSCLE TONE: RUE: Within functional limits and LUE: Within functional limits  COGNITION: Overall cognitive status: Within functional limits for tasks assessed  Pt reported some difficulty remembering all seasoning and other ingredients  when preparing a meal though recalls ingredients with extra time. Pt reported "I can't get my words and everything to line up." Pt reported some difficulty with word-finding. Pt reported some very mild slurred speech later in the evening. Pt reported difficulties with short-term memory.  OT noted slightly increased time for pt to respond though pt answered questions appropriately.  Sunshine, pencil, bed: Pt recalled 3/3 words after approx. 3 minutes.  VISION: Subjective report: Pt reported lights "mess with my eyes at night" when driving and when riding in vehicle. Pt accurately read clock on wall. Baseline vision: Wears glasses for reading only Visual history:  none noted  VISION ASSESSMENT: Tracking: Visual tracking to all 4 quadrants, no concerns noted.  PERCEPTION: Not tested  PRAXIS: Not tested  OBSERVATIONS: Pt ambulated ind without A/E. Pt was pleasant and appeared well-kept.  TREATMENT DATE:     Self-Care OT educated pt on reducing fall risk, dx, prognosis, safe return to activities gradually, BP management, BP parameters, joint protection. Pt verbalized understanding of all. Pt reported taking BP medication and drinking water today. Pt reported intent to avoid standing of step stools/ladders after recent near-fall.  Assessed BP, see vital signs. BP within therapeutic parameters, therefore tasks continued.  Assessed BP after first set of exercises listed below. See vital signs. BP within therapeutic parameters, therefore tasks continued.  Neuro Re-Ed HEP update: Red theraband - to improve affected UE strengthening, grip strength, gross motor coordination, attention to upright seated posture and breathing. Pt returned demo with v/c for upright seated posture and UE positioning. OT educated pt on UE anatomy, pain threshold, avoiding sharp pain. Pt  acknowledged understanding. Access Code: [facility] URL: [email or web address] Date: 06/08/2023 Prepared by: Carilyn Goodpasture  Exercises - Seated Shoulder Flexion with Self-Anchored Resistance  - 2 x daily - 2 sets - 10 reps - Seated Elbow Flexion with Self-Anchored Resistance  - 2 x daily - 2 sets - 10 reps - Seated Elbow Extension with Self-Anchored Resistance  - 2 x daily - 2 sets - 10 reps - Seated Bilateral Shoulder External Rotation with Resistance  - 2 x daily - 2 sets - 10 reps - Seated Shoulder Horizontal Abduction with Resistance - Thumbs Up  - 2 x daily - 2 sets - 10 reps  PATIENT EDUCATION: Education details: see today's tx above Person educated: Patient Education method: Explanation, Demonstration, and Handouts Education comprehension: verbalized understanding and returned demonstration  HOME EXERCISE PROGRAM: 05/31/23 - Memory compensation strategies, Activities to keep thinking skills sharp (handouts, see pt instructions), green theraputty Access Code: [email or web address] 06/08/23 - Red Theraband. Access Code: [email or web address]   GOALS: Goals reviewed with patient? Yes  SHORT TERM GOALS: Target date: 06/25/23  Patient will demonstrate initial RUE HEP with visual handouts and 25% verbal cues or less for proper execution.  Baseline: new to outpt OT Goal status: in progress  2.  Pt will recall or demo at least 3 memory compensation strategies. Baseline: Pt reported mild difficulty with word-finding and short-term memory recall. Goal status: in progress  3.  Pt will demonstrate ability to retrieve a 3 lbs object from overhead shelf at 115*with RUE, demonstrating good control.  Baseline: Pt reported and demo'd decreased RUE grip strength, extra time to achieve full shoulder ROM of RUE. Goal status: INITIAL  4.  Pt will report improved efficiency and decreased fatigue when completing ADL LB dressing and IADL household management tasks, indicating improved activity tolerance.   Baseline: Pt reported extra time required for LB dressing and increased fatigue with IADL household management tasks. Goal status: INITIAL  LONG TERM GOALS: Target date: 07/23/23  Patient will ind demonstrate updated RUE HEP with visual handouts. Baseline: new to outpt OT Goal status: INITIAL  2.  Pt will demo at least 50 lbs (average of 3 trials) RUE grip strength as needed to carpentry tasks. Baseline: Grip strength: Right: 40, 29, 33 (34 lbs average) lbs; Left: 68, 71, 74 (71 lbs average) lbs Goal status: in progress  3.  Patient will demo improved FM coordination as evidenced by completing nine-hole peg with use of RUE in 22 seconds or less.  Baseline: 9 Hole Peg test: Right: 24 sec; Left: 23 sec Goal status: INITIAL  4.  Patient will demonstrate at least 16% improvement with quick Dash score (reporting 20.4% disability or less) indicating improved functional use of affected  extremity.   Baseline: 36.4% deficit Goal status: INITIAL  5.  Pt will ind complete simple simulated medication management task with 100% accuracy.  Baseline: Pt's daughter assists pt with managing medications. Goal status: INITIAL  6.  Pt will report no more than moderate difficulty with work-related carpentry tasks per QuickDASH. Baseline: Pt reported unable to complete carpentry tasks for work per [employer]. Goal status: INITIAL  ASSESSMENT:  CLINICAL IMPRESSION: Pt tolerated tasks well. Pt reported some fatigue after returning demo of HEP theraband update. Pt would benefit from skilled OT services in the outpatient setting to work on impairments as noted below to help pt return to PLOF as able.     PERFORMANCE DEFICITS: in functional skills including ADLs, IADLs, coordination, dexterity, proprioception, ROM, strength, flexibility, Fine motor control, Gross motor control, mobility, balance, body mechanics, endurance, vision, and UE functional use, cognitive skills including attention, energy/drive, and  memory, and psychosocial skills including environmental adaptation.   IMPAIRMENTS: are limiting patient from ADLs, IADLs, rest and sleep, work, leisure, and social participation.   CO-MORBIDITIES: may have co-morbidities  that affects occupational performance. Patient will benefit from skilled OT to address above impairments and improve overall function.  MODIFICATION OR ASSISTANCE TO COMPLETE EVALUATION: No modification of tasks or assist necessary to complete an evaluation.  OT OCCUPATIONAL PROFILE AND HISTORY: Detailed assessment: Review of records and additional review of physical, cognitive, psychosocial history related to current functional performance.  CLINICAL DECISION MAKING: Moderate - several treatment options, min-mod task modification necessary  REHAB POTENTIAL: Good  EVALUATION COMPLEXITY: Moderate    PLAN:  OT FREQUENCY: 2x/week  OT DURATION: 6 weeks (dates extended to allow for scheduling)  PLANNED INTERVENTIONS: 16109 OT Re-evaluation, 97535 self care/ADL training, 60454 therapeutic exercise, 97530 therapeutic activity, 97112 neuromuscular re-education, 97140 manual therapy, 97035 ultrasound, 97018 paraffin, 09811 fluidotherapy, 97010 moist heat, 97010 cryotherapy, 97760 Orthotics management and training, 91478 Splinting (initial encounter), [id number] Subsequent splinting/medication, passive range of motion, functional mobility training, visual/perceptual remediation/compensation, energy conservation, patient/family education, and DME and/or AE instructions  RECOMMENDED OTHER SERVICES: PT and SLP evals completed  CONSULTED AND AGREED WITH PLAN OF CARE: Patient  PLAN FOR NEXT SESSION:  Monitor BP Shoulder IR ROM with cane - add to theraband HEP code FM coordination HEP FM coordination activities and general strengthening of affected UE (e.g. Blaze Pods, Edwina Barth, etc.)  Review Theraputty HEP PRN  Wynetta Emery, OT 06/08/2023, 12:21 PM

## 2023-06-10 ENCOUNTER — Ambulatory Visit: Payer: 59 | Admitting: Occupational Therapy

## 2023-06-10 ENCOUNTER — Ambulatory Visit: Payer: 59

## 2023-06-10 VITALS — BP 129/87 | HR 80

## 2023-06-10 VITALS — BP 147/90 | HR 88

## 2023-06-10 DIAGNOSIS — R2681 Unsteadiness on feet: Secondary | ICD-10-CM

## 2023-06-10 DIAGNOSIS — R29898 Other symptoms and signs involving the musculoskeletal system: Secondary | ICD-10-CM

## 2023-06-10 DIAGNOSIS — R278 Other lack of coordination: Secondary | ICD-10-CM

## 2023-06-10 DIAGNOSIS — M6281 Muscle weakness (generalized): Secondary | ICD-10-CM | POA: Diagnosis not present

## 2023-06-10 DIAGNOSIS — R29818 Other symptoms and signs involving the nervous system: Secondary | ICD-10-CM

## 2023-06-10 DIAGNOSIS — R2689 Other abnormalities of gait and mobility: Secondary | ICD-10-CM

## 2023-06-10 NOTE — Therapy (Signed)
 OUTPATIENT OCCUPATIONAL THERAPY NEURO Treatment  Patient Name: Derrick Houston MRN: [id number] DOB:Sep 09, 1965, 58 y.o., male Today's Date: 06/10/2023  PCP: Modesta Messing  REFERRING PROVIDER: Arma Heading   END OF SESSION:  OT End of Session - 06/10/23 1116     Visit Number 4    Number of Visits 13    Date for OT Re-Evaluation 07/23/23    Authorization Type [employer] 2025, no auth required, VL: 90 combined    OT Start Time 0932    OT Stop Time 1010    OT Time Calculation (min) 38 min    Activity Tolerance Patient tolerated treatment well    Behavior During Therapy WFL for tasks assessed/performed                Past Medical History:  Diagnosis Date   ETOH abuse    Stroke (HCC)    No past surgical history on file. There are no active problems to display for this patient.   ONSET DATE: 05/21/23 (referral date)  REFERRING DIAG: I63.9 (ICD-10-CM) - Impending cerebrovascular accident (HCC)   THERAPY DIAG:  Muscle weakness (generalized)  Other lack of coordination  Other symptoms and signs involving the nervous system  Other symptoms and signs involving the musculoskeletal system  Rationale for Evaluation and Treatment: Rehabilitation  SUBJECTIVE:   SUBJECTIVE STATEMENT: Pt reported not feeling well d/t headache pain though requested to continue therapy session.  Pt reported trying to dribble and shoot basketballs recently.  Pt accompanied by: self  PERTINENT HISTORY: CVA, ETOH abuse, marijuana use, HTN, headache, prediabetes  05/02/23 brain MRI IMPRESSION: Normal examination. No abnormality seen to explain the clinical presentation.  PRECAUTIONS: Fall  WEIGHT BEARING RESTRICTIONS: No  PAIN:  Are you having pain? 5/10 headache  FALLS: Has patient fallen in last 6 months? No,   06/08/23 update: Pt reported near-fall on Friday 06/04/23 when painting a wall in home while standing on a stepstool, pt reported off-balance though recovered ind.  Pt reported completing task though "it was tough."  LIVING ENVIRONMENT:  Lives with: lives with their family Lives in: House/apartment Stairs: Yes: Internal: 2 steps; on right going up Has following equipment at home: Single point cane and Grab bars  PLOF: ind, Occupation: carpentry, currently driving  PATIENT GOALS: "to get back to work"  OBJECTIVE:  Note: Objective measures were completed at Evaluation unless otherwise noted.  HAND DOMINANCE: Right  ADLs: Overall ADLs: ind Transfers/ambulation related to ADLs: ind Eating: ind, including cutting with knife Grooming: ind UB Dressing: ind, no difficulty zippers/buttons LB Dressing: ind with extra time, no difficulty tying shoes Toileting: ind Bathing: ind, some difficulty reaching behind back with R hand though can manage with extra time Tub Shower transfers: ind Equipment: Grab bars and Walk in shower  IADLs: Shopping: ind, some assistance from family, some difficulty carrying heavy cases of water d/t weakness of R side Light housekeeping: ind vacuuming, laundry, dishwashing, sweeping - some fatigue with sweeping and vacuuming "I don't have the stamina I used to have." Meal Prep: often orders fast food currently, different from before the stroke. Pt reported increased difficulty with meal prep. Community mobility: currently driving, some help from family for errands Medication management: 05/31/23 update - Pt takes medication ind as prescribed, daughter helps retrieve medication from pharmacy.  Handwriting:  Pt reported slower speed of handwriting.  Work-related tasks: Pt reported unable to complete work-related carpentry tasks, such as difficulty driving nail using hammer with RUE.  MOBILITY STATUS: Independent  POSTURE COMMENTS:  Ind sitting balance  ACTIVITY TOLERANCE: Activity tolerance: increased fatigue during the day. "I don't have the stamina I used to have."  FUNCTIONAL OUTCOME MEASURES: Quick Dash: 36.4%  deficit    UPPER EXTREMITY ROM:    Active ROM Right eval Left eval  Shoulder flexion WFL with extra time [facility]  Shoulder abduction WFL with extra time, v/c to breathe d/t pt holding breath WFL  Shoulder adduction WFL with extra time [facility]  Shoulder extension    Shoulder internal rotation    Shoulder external rotation    Elbow flexion    Elbow extension    Wrist flexion    Wrist extension    Wrist ulnar deviation    Wrist radial deviation    Wrist pronation    Wrist supination    (Blank rows = not tested)  UPPER EXTREMITY MMT:     MMT Right eval Left eval  Shoulder flexion 3+ 5  Shoulder abduction    Shoulder adduction    Shoulder extension    Shoulder internal rotation    Shoulder external rotation    Middle trapezius    Lower trapezius    Elbow flexion    Elbow extension    Wrist flexion    Wrist extension    Wrist ulnar deviation    Wrist radial deviation    Wrist pronation    Wrist supination    (Blank rows = not tested)  HAND FUNCTION: Grip strength: Right: 40, 29, 33 (34 lbs average) lbs; Left: 68, 71, 74 (71 lbs average) lbs  Pt benefited from v/c to breathe during task d/t pt tending to hold breath during task.  COORDINATION: 9 Hole Peg test: Right: 24 sec; Left: 23 sec  Pt reported coordination is "off a little bit." Pt reported difficulty using TV remote. Pt reported dropping phone several times this week.  SENSATION: Pt denied tingling/numbness in BUE. Pt reported tingling in R foot "front half towards the toes. "  EDEMA: none noted  MUSCLE TONE: RUE: Within functional limits and LUE: Within functional limits  COGNITION: Overall cognitive status: Within functional limits for tasks assessed  Pt reported some difficulty remembering all seasoning and other ingredients when preparing a meal though recalls ingredients with extra time. Pt reported "I can't get my words and everything to line up." Pt reported some difficulty with word-finding. Pt  reported some very mild slurred speech later in the evening. Pt reported difficulties with short-term memory.  OT noted slightly increased time for pt to respond though pt answered questions appropriately.  Sunshine, pencil, bed: Pt recalled 3/3 words after approx. 3 minutes.  VISION: Subjective report: Pt reported lights "mess with my eyes at night" when driving and when riding in vehicle. Pt accurately read clock on wall. Baseline vision: Wears glasses for reading only Visual history:  none noted  VISION ASSESSMENT: Tracking: Visual tracking to all 4 quadrants, no concerns noted.  PERCEPTION: Not tested  PRAXIS: Not tested  OBSERVATIONS: Pt ambulated ind without A/E. Pt was pleasant and appeared well-kept.  TREATMENT DATE:     Self-Care Treatment took place in lower light environment in treatment room d/t pt c/o headache symptoms.  Assessed vital signs, see vital signs. Vitals within therapeutic parameters. Despite not feeling well, pt requested to continue with therapy session. Pt reported not eating today though slept well and staying hydrated. Pt reported headache symptoms began this morning when pt woke up.  OT educated pt on deep breathing strategies, vitals parameters, healthy diet, eating food before therapy sessions, UE anatomy, low lighting to improve headache symptoms, prognosis, importance of consistent practice of tasks. Pt verbalized understanding of all. Pt reported cooking meals more often at home for healthier diet.  TherAct OT initiated FM coordination HEP (handout provided, see pt instructions) - to improve affected UE FM coordination, dexterity, proprioception. Pt returned demonstration of each exercise: Picking up objects and placing in a container with slot Stacking towers of coins  Finger-to-palm then palm-to-finger translation of small  items Shuffling cards Turning cards over 1 at a time Hold deck of cards in palm of hand and push off 1 card at a time from the top of the deck using only thumb Tear piece of tissue and rolling into small balls with fingertips Meditation balls - clockwise then counterclockwise Rolling pen between fingers and thumb, pen "twirl" (rotation), pen "shift" (translation)   TherEx HEP update: R shoulder IR AROM - to improve affected UE AROM as needed for ADL/IADL tasks. Pt returned demo. Access Code: [facility] URL: [email or web address] Date: 06/10/2023 Prepared by: Carilyn Goodpasture - Standing Bilateral Shoulder Internal Rotation AAROM with Dowel  - 2 x daily - 2 sets - 10 reps - Standing Shoulder Internal Rotation AAROM with Dowel  - 2 x daily - 2 sets - 10 reps  PATIENT EDUCATION: Education details: see today's tx above Person educated: Patient Education method: Explanation, Demonstration, and Handouts Education comprehension: verbalized understanding and returned demonstration  HOME EXERCISE PROGRAM: 05/31/23 - Memory compensation strategies, Activities to keep thinking skills sharp (handouts, see pt instructions), green theraputty Access Code: [email or web address] 06/08/23 and 06/10/23 - Red Theraband and shoulder IR. Access Code: [email or web address]   GOALS: Goals reviewed with patient? Yes  SHORT TERM GOALS: Target date: 06/25/23  Patient will demonstrate initial RUE HEP with visual handouts and 25% verbal cues or less for proper execution.  Baseline: new to outpt OT Goal status: in progress  2.  Pt will recall or demo at least 3 memory compensation strategies. Baseline: Pt reported mild difficulty with word-finding and short-term memory recall. Goal status: in progress  3.  Pt will demonstrate ability to retrieve a 3 lbs object from overhead shelf at 115*with RUE, demonstrating good control.  Baseline: Pt reported and demo'd decreased RUE grip strength, extra time to achieve full shoulder ROM of  RUE. Goal status: INITIAL  4.  Pt will report improved efficiency and decreased fatigue when completing ADL LB dressing and IADL household management tasks, indicating improved activity tolerance.  Baseline: Pt reported extra time required for LB dressing and increased fatigue with IADL household management tasks. Goal status: INITIAL  LONG TERM GOALS: Target date: 07/23/23  Patient will ind demonstrate updated RUE HEP with visual handouts. Baseline: new to outpt OT Goal status: INITIAL  2.  Pt will demo at least 50 lbs (average of 3 trials) RUE grip strength as needed to carpentry tasks. Baseline: Grip strength: Right: 40, 29, 33 (34 lbs average) lbs; Left: 68, 71, 74 (71 lbs average) lbs Goal status: in progress  3.  Patient  will demo improved FM coordination as evidenced by completing nine-hole peg with use of RUE in 22 seconds or less.  Baseline: 9 Hole Peg test: Right: 24 sec; Left: 23 sec Goal status: INITIAL  4.  Patient will demonstrate at least 16% improvement with quick Dash score (reporting 20.4% disability or less) indicating improved functional use of affected extremity.   Baseline: 36.4% deficit Goal status: INITIAL  5.  Pt will ind complete simple simulated medication management task with 100% accuracy.  Baseline: Pt's daughter assists pt with managing medications. Goal status: INITIAL  6.  Pt will report no more than moderate difficulty with work-related carpentry tasks per QuickDASH. Baseline: Pt reported unable to complete carpentry tasks for work per [employer]. Goal status: INITIAL  ASSESSMENT:  CLINICAL IMPRESSION: Pt reported increased headache pain today and reported not feeling well though overall tolerated tasks well. Vital signs within therapeutic parameters throughout session, see vital signs. Pt would benefit from skilled OT services in the outpatient setting to work on impairments as noted below to help pt return to PLOF as able.     PERFORMANCE  DEFICITS: in functional skills including ADLs, IADLs, coordination, dexterity, proprioception, ROM, strength, flexibility, Fine motor control, Gross motor control, mobility, balance, body mechanics, endurance, vision, and UE functional use, cognitive skills including attention, energy/drive, and memory, and psychosocial skills including environmental adaptation.   IMPAIRMENTS: are limiting patient from ADLs, IADLs, rest and sleep, work, leisure, and social participation.   CO-MORBIDITIES: may have co-morbidities  that affects occupational performance. Patient will benefit from skilled OT to address above impairments and improve overall function.  MODIFICATION OR ASSISTANCE TO COMPLETE EVALUATION: No modification of tasks or assist necessary to complete an evaluation.  OT OCCUPATIONAL PROFILE AND HISTORY: Detailed assessment: Review of records and additional review of physical, cognitive, psychosocial history related to current functional performance.  CLINICAL DECISION MAKING: Moderate - several treatment options, min-mod task modification necessary  REHAB POTENTIAL: Good  EVALUATION COMPLEXITY: Moderate    PLAN:  OT FREQUENCY: 2x/week  OT DURATION: 6 weeks (dates extended to allow for scheduling)  PLANNED INTERVENTIONS: 96295 OT Re-evaluation, 97535 self care/ADL training, 28413 therapeutic exercise, 97530 therapeutic activity, 97112 neuromuscular re-education, 97140 manual therapy, 97035 ultrasound, 97018 paraffin, 24401 fluidotherapy, 97010 moist heat, 97010 cryotherapy, 97760 Orthotics management and training, 02725 Splinting (initial encounter), [id number] Subsequent splinting/medication, passive range of motion, functional mobility training, visual/perceptual remediation/compensation, energy conservation, patient/family education, and DME and/or AE instructions  RECOMMENDED OTHER SERVICES: PT and SLP evals completed  CONSULTED AND AGREED WITH PLAN OF CARE: Patient  PLAN FOR NEXT  SESSION:  Monitor BP Review HEP PRN Assess progress towards some goals FM coordination activities and general strengthening of affected UE (e.g. Blaze Pods, Edwina Barth, etc.)  Wynetta Emery, OT 06/10/2023, 11:22 AM

## 2023-06-10 NOTE — Therapy (Signed)
 OUTPATIENT PHYSICAL THERAPY NEURO TREATMENT/ RE-CERT   Patient Name: Derrick Houston MRN: [id number] DOB:[birth year], 58 y.o., male Today's Date: 06/10/2023   PCP: Warnell Forester, PA-C REFERRING PROVIDER: Warnell Forester, PA-C  END OF SESSION:  PT End of Session - 06/10/23 0842     Visit Number 8    Number of Visits 17   re-cert   Date for PT Re-Evaluation 44/01/02   re-cert   Authorization Type [employer]    PT Start Time 0845    PT Stop Time 0926    PT Time Calculation (min) 41 min    Equipment Utilized During Treatment Gait belt    Activity Tolerance Patient tolerated treatment well    Behavior During Therapy WFL for tasks assessed/performed                Past Medical History:  Diagnosis Date   ETOH abuse    Stroke [facility])    History reviewed. No pertinent surgical history. There are no active problems to display for this patient.   ONSET DATE: 05/11/23 referral  REFERRING DIAG:  V25.36 (ICD-10-CM) - Personal history of transient ischemic attack (TIA), and cerebral infarction without residual deficits  R53.1 (ICD-10-CM) - Weakness of right side of body    THERAPY DIAG:  Muscle weakness (generalized) - Plan: PT plan of care cert/re-cert  Other lack of coordination - Plan: PT plan of care cert/re-cert  Other abnormalities of gait and mobility - Plan: PT plan of care cert/re-cert  Unsteadiness on feet - Plan: PT plan of care cert/re-cert  Rationale for Evaluation and Treatment: Rehabilitation  SUBJECTIVE:                                                                                                                                                                                             SUBJECTIVE STATEMENT: Patient reports doing fair. Does have a slight HA, 5/10. BP has been stable at home. Has neuro appt coming up next week. Denies falls.   Pt accompanied by: self  PERTINENT HISTORY: CVA, ETOH abuse  PAIN:  Are you having pain? Yes: NPRS scale:  5/10 Pain location: Frontal head/eyes Pain description: Headache/pressure   PRECAUTIONS: Fall   PATIENT GOALS: "to return to work"  OBJECTIVE:  Note: Objective measures were completed at Evaluation unless otherwise noted.  DIAGNOSTIC FINDINGS: 05/02/23 brain MRI IMPRESSION: Normal examination. No abnormality seen to explain the clinical presentation.  VITALS  Vitals:   06/10/23 0849 06/10/23 0911  BP: 134/87 (!) 147/90  Pulse: 76 88  TREATMENT: Theract:  [facility] PT Assessment - 06/10/23 0001       Functional Gait  Assessment   Gait assessed  Yes    Gait Level Surface Walks 20 ft in less than 7 sec but greater than 5.5 sec, uses assistive device, slower speed, mild gait deviations, or deviates 6-10 in outside of the 12 in walkway width.    Change in Gait Speed Able to smoothly change walking speed without loss of balance or gait deviation. Deviate no more than 6 in outside of the 12 in walkway width.    Gait with Horizontal Head Turns Performs head turns smoothly with slight change in gait velocity (eg, minor disruption to smooth gait path), deviates 6-10 in outside 12 in walkway width, or uses an assistive device.    Gait with Vertical Head Turns Performs task with slight change in gait velocity (eg, minor disruption to smooth gait path), deviates 6 - 10 in outside 12 in walkway width or uses assistive device    Gait and Pivot Turn Pivot turns safely in greater than 3 sec and stops with no loss of balance, or pivot turns safely within 3 sec and stops with mild imbalance, requires small steps to catch balance.    Step Over Obstacle Is able to step over one shoe box (4.5 in total height) without changing gait speed. No evidence of imbalance.    Gait with Narrow Base of Support Is able to ambulate for 10 steps heel to toe with no staggering.    Gait with  Eyes Closed Walks 20 ft, uses assistive device, slower speed, mild gait deviations, deviates 6-10 in outside 12 in walkway width. Ambulates 20 ft in less than 9 sec but greater than 7 sec.    Ambulating Backwards Walks 20 ft, uses assistive device, slower speed, mild gait deviations, deviates 6-10 in outside 12 in walkway width.    Steps Alternating feet, no rail.    Total Score 23           -30LB kettlebell floor -> waist   -progressed to 40lbs (30lb kb + 2 5# wrist weights) floor -> waist  NMR:  -standing on airex marching in place -> reciprocal shoulder flexion-> added 10lb kettlebell  -standing on foam balance beam 1kg ball chest press-> shoulder press -> trunk rotation + chest press -standing on foam balance beam rebounder 1kg ball-> dual cog task naming animals-> dual cog task naming foods in alphabetical order   PATIENT EDUCATION: Education details: exam findings, PT POC, dual tasking Person educated: Patient Education method: Explanation Education comprehension: verbalized understanding  HOME EXERCISE PROGRAM: Access Code: [email or web address] URL: [email or web address] Date: 05/17/2023 Prepared by: Merry Lofty  Exercises - Side Stepping with Resistance at Ankles  - 1 x daily - 7 x weekly - 3 sets - 10 reps - Forward Monster Walks  - 1 x daily - 7 x weekly - 3 sets - 10 reps - Backward Monster Walks  - 1 x daily - 7 x weekly - 3 sets - 10 reps - Runner's Step Up/Down  - 1 x daily - 7 x weekly - 3 sets - 10 reps - Lateral Step Ups  - 1 x daily - 7 x weekly - 3 sets - 10 reps - Push-Up on Counter  - 1 x daily - 7 x weekly - 3 sets - 10 reps - 1 second hold  GOALS: Goals reviewed with patient? Yes  SHORT TERM GOALS: = LTG based on PT POC length  LONG TERM GOALS: Target date: 06/11/23  Pt will be independent with final HEP for improved functional strength and mobility  Baseline: to be provided; provided Goal status: MET  2.  Pt will improve FGA to >/= 23/30  to demonstrate improved balance and reduced fall risk  Baseline: 18/30, 23/30 Goal status: MET  3.  Patient will negotiate at least 12 steps with no hand rails, reciprocal pattern, independently to demonstrate improved eccentric control of R knee Baseline: 4 steps step-to and required B HR, 12 steps no HR + SBA and reciprocal pattern Goal status: IN PROGRESS  4.  Patient will lift at least 50 lbs from the ground to his waist to simulate work-related duties Baseline: unable; 40lbs with great difficulty Goal status: IN PROGRESS   NEW LONG TERM GOALS: 07/09/23   1. Pt will be independent with final HEP for improved functional strength/endurance Baseline: to be updated Goal status: NEW  2.  Patient will lift at least 50 lbs from the ground to his waist to simulate work-related duties Baseline: 40lbs with great difficulty Goal status: NEW  3. Patient will be able to balance on simulated ladder step with B LE while completing overhead manual task with item weighing at least 5lbs for at least 1 min with supervision  Baseline: unable  Goal status: NEW     ASSESSMENT:  CLINICAL IMPRESSION: Patient seen for skilled PT session with emphasis on goal assessment and re-cert. He met 2/4 LTG and made good progress toward remaining goals. He is grossly limited by R hemibody weakness/limited endurance and headaches. His job duties require complex levels of dual motor and cog tasks, especially overhead, which he currently cannot do. He demonstrates greater and faster levels of fatigue with any dual motor/cog task overlay, therefore increasing his risk for falls and morbidity. He continues to benefit from skilled PT services.    OBJECTIVE IMPAIRMENTS: Abnormal gait, decreased balance, decreased coordination, decreased knowledge of condition, decreased strength, impaired sensation, and impaired UE functional use.   ACTIVITY LIMITATIONS: carrying, lifting, bending, squatting, stairs, reach over head,  locomotion level, and caring for others  PARTICIPATION LIMITATIONS: meal prep, cleaning, interpersonal relationship, driving, shopping, community activity, occupation, and yard work  PERSONAL FACTORS: Age, Fitness, Profession, Time since onset of injury/illness/exacerbation, and Transportation are also affecting patient's functional outcome.   REHAB POTENTIAL: Good  CLINICAL DECISION MAKING: Stable/uncomplicated  EVALUATION COMPLEXITY: Low  PLAN:  PT FREQUENCY: 2x/week 2x/ week (re-cert)  PT DURATION: 4 weeks 4 weeks (re-cert)  PLANNED INTERVENTIONS: 40981- PT Re-evaluation, 97110-Therapeutic exercises, 97530- Therapeutic activity, 97112- Neuromuscular re-education, 97535- Self Care, 19147- Manual therapy, [phone]- Gait training, [phone]- Aquatic Therapy, Patient/Family education, Balance training, Stair training, Dry Needling, Vestibular training, Visual/preceptual remediation/compensation, and DME instructions  PLAN FOR NEXT SESSION: high level balance, R LE eccentric control, heavy lifting, CHECK BP, return to work tasks (overhead, climbing higher stairs), basketball, work w/power tools and imitate ladders/step stools    Westley Foots, PT Westley Foots, PT, DPT, CBIS  06/10/2023, 9:42 AM

## 2023-06-14 ENCOUNTER — Ambulatory Visit: Payer: 59

## 2023-06-14 ENCOUNTER — Encounter: Payer: Self-pay | Admitting: Speech Pathology

## 2023-06-14 ENCOUNTER — Ambulatory Visit: Payer: 59 | Attending: Physician Assistant | Admitting: Occupational Therapy

## 2023-06-14 ENCOUNTER — Ambulatory Visit: Payer: 59 | Admitting: Speech Pathology

## 2023-06-14 VITALS — BP 136/91 | HR 108

## 2023-06-14 VITALS — BP 137/94 | HR 84

## 2023-06-14 DIAGNOSIS — R278 Other lack of coordination: Secondary | ICD-10-CM | POA: Insufficient documentation

## 2023-06-14 DIAGNOSIS — R471 Dysarthria and anarthria: Secondary | ICD-10-CM | POA: Insufficient documentation

## 2023-06-14 DIAGNOSIS — R4701 Aphasia: Secondary | ICD-10-CM | POA: Insufficient documentation

## 2023-06-14 DIAGNOSIS — R29898 Other symptoms and signs involving the musculoskeletal system: Secondary | ICD-10-CM | POA: Diagnosis present

## 2023-06-14 DIAGNOSIS — R29818 Other symptoms and signs involving the nervous system: Secondary | ICD-10-CM | POA: Diagnosis present

## 2023-06-14 DIAGNOSIS — R2689 Other abnormalities of gait and mobility: Secondary | ICD-10-CM | POA: Diagnosis present

## 2023-06-14 DIAGNOSIS — R2681 Unsteadiness on feet: Secondary | ICD-10-CM

## 2023-06-14 DIAGNOSIS — M6281 Muscle weakness (generalized): Secondary | ICD-10-CM

## 2023-06-14 NOTE — Therapy (Signed)
 OUTPATIENT OCCUPATIONAL THERAPY NEURO Treatment  Patient Name: REGINA COPPOLINO MRN: [id number] DOB:[birth year], 58 y.o., male Today's Date: 06/14/2023  PCP: Barnie Mort, PA-C  REFERRING PROVIDER: Arma Heading   END OF SESSION:  OT End of Session - 06/14/23 0939     Visit Number 5    Number of Visits 13    Date for OT Re-Evaluation 07/23/23    Authorization Type U[employer] 2025, no auth required, VL: 90 combined    OT Start Time 0845    OT Stop Time 0929    OT Time Calculation (min) 44 min    Activity Tolerance Patient tolerated treatment well    Behavior During Therapy WFL for tasks assessed/performed                 Past Medical History:  Diagnosis Date   ETOH abuse    Stroke (HCC)    No past surgical history on file. There are no active problems to display for this patient.   ONSET DATE: 05/21/23 (referral date)  REFERRING DIAG: I63.9 (ICD-10-CM) - Impending cerebrovascular accident (HCC)   THERAPY DIAG:  Muscle weakness (generalized)  Other lack of coordination  Other symptoms and signs involving the nervous system  Other symptoms and signs involving the musculoskeletal system  Rationale for Evaluation and Treatment: Rehabilitation  SUBJECTIVE:   SUBJECTIVE STATEMENT: Pt reported continued headache pain "all weekend." Pt reported upcoming appointment on Wednesday with neurologist. OT recommended to pt to f/u with MD about continuing headache concerns, pt verbalized understanding. Pt reported poor sleep quality last night though slept "okay" on Friday and Saturday night.  Pt reported completing HEP, exercises are "good." Pt reported climbing ladder to third step with son supervising at home.   Pt accompanied by: self  PERTINENT HISTORY: CVA, ETOH abuse, marijuana use, HTN, headache, prediabetes  05/02/23 brain MRI IMPRESSION: Normal examination. No abnormality seen to explain the clinical presentation.  PRECAUTIONS: Fall  WEIGHT BEARING  RESTRICTIONS: No  PAIN:  Are you having pain? 4/10 headache  FALLS: Has patient fallen in last 6 months? No,   06/08/23 update: Pt reported near-fall on Friday 06/04/23 when painting a wall in home while standing on a stepstool, pt reported off-balance though recovered ind. Pt reported completing task though "it was tough."  LIVING ENVIRONMENT:  Lives with: lives with their family Lives in: House/apartment Stairs: Yes: Internal: 2 steps; on right going up Has following equipment at home: Single point cane and Grab bars  PLOF: ind, Occupation: carpentry, currently driving  PATIENT GOALS: "to get back to work"  OBJECTIVE:  Note: Objective measures were completed at Evaluation unless otherwise noted.  HAND DOMINANCE: Right  ADLs: Overall ADLs: ind Transfers/ambulation related to ADLs: ind Eating: ind, including cutting with knife Grooming: ind UB Dressing: ind, no difficulty zippers/buttons LB Dressing: ind with extra time, no difficulty tying shoes Toileting: ind Bathing: ind, some difficulty reaching behind back with R hand though can manage with extra time Tub Shower transfers: ind Equipment: Grab bars and Walk in shower  IADLs: Shopping: ind, some assistance from family, some difficulty carrying heavy cases of water d/t weakness of R side Light housekeeping: ind vacuuming, laundry, dishwashing, sweeping - some fatigue with sweeping and vacuuming "I don't have the stamina I used to have." Meal Prep: often orders fast food currently, different from before the stroke. Pt reported increased difficulty with meal prep. Community mobility: currently driving, some help from family for errands Medication management: 05/31/23 update - Pt takes medication  ind as prescribed, daughter helps retrieve medication from pharmacy.  Handwriting:  Pt reported slower speed of handwriting.  Work-related tasks: Pt reported unable to complete work-related carpentry tasks, such as difficulty driving  nail using hammer with RUE.  MOBILITY STATUS: Independent  POSTURE COMMENTS:  Ind sitting balance  ACTIVITY TOLERANCE: Activity tolerance: increased fatigue during the day. "I don't have the stamina I used to have."  FUNCTIONAL OUTCOME MEASURES: Quick Dash: 36.4% deficit    UPPER EXTREMITY ROM:    Active ROM Right eval Left eval  Shoulder flexion WFL with extra time [facility]  Shoulder abduction WFL with extra time, v/c to breathe d/t pt holding breath WFL  Shoulder adduction WFL with extra time [facility]  Shoulder extension    Shoulder internal rotation    Shoulder external rotation    Elbow flexion    Elbow extension    Wrist flexion    Wrist extension    Wrist ulnar deviation    Wrist radial deviation    Wrist pronation    Wrist supination    (Blank rows = not tested)  UPPER EXTREMITY MMT:     MMT Right eval Left eval  Shoulder flexion 3+ 5  Shoulder abduction    Shoulder adduction    Shoulder extension    Shoulder internal rotation    Shoulder external rotation    Middle trapezius    Lower trapezius    Elbow flexion    Elbow extension    Wrist flexion    Wrist extension    Wrist ulnar deviation    Wrist radial deviation    Wrist pronation    Wrist supination    (Blank rows = not tested)  HAND FUNCTION: Grip strength: Right: 40, 29, 33 (34 lbs average) lbs; Left: 68, 71, 74 (71 lbs average) lbs  Pt benefited from v/c to breathe during task d/t pt tending to hold breath during task.  COORDINATION: 9 Hole Peg test: Right: 24 sec; Left: 23 sec  Pt reported coordination is "off a little bit." Pt reported difficulty using TV remote. Pt reported dropping phone several times this week.  SENSATION: Pt denied tingling/numbness in BUE. Pt reported tingling in R foot "front half towards the toes. "  EDEMA: none noted  MUSCLE TONE: RUE: Within functional limits and LUE: Within functional limits  COGNITION: Overall cognitive status: Within functional limits for  tasks assessed  Pt reported some difficulty remembering all seasoning and other ingredients when preparing a meal though recalls ingredients with extra time. Pt reported "I can't get my words and everything to line up." Pt reported some difficulty with word-finding. Pt reported some very mild slurred speech later in the evening. Pt reported difficulties with short-term memory.  OT noted slightly increased time for pt to respond though pt answered questions appropriately.  Sunshine, pencil, bed: Pt recalled 3/3 words after approx. 3 minutes.  VISION: Subjective report: Pt reported lights "mess with my eyes at night" when driving and when riding in vehicle. Pt accurately read clock on wall. Baseline vision: Wears glasses for reading only Visual history:  none noted  VISION ASSESSMENT: Tracking: Visual tracking to all 4 quadrants, no concerns noted.  PERCEPTION: Not tested  PRAXIS: Not tested  OBSERVATIONS: Pt ambulated ind without A/E. Pt was pleasant and appeared well-kept.  TREATMENT DATE:     Self-Care BP assessed throughout session, see vital signs. OT educated on BP management, pt acknowledged understanding. Pt reported monitoring BP 1x per day. Pt reported drinking water and eating food today. Pt asymptomatic throughout session, denied dizziness/lightheadedness throughout session. OT educated pt on sleep quality and potential effect on BP. Pt verbalized understanding.  OT educated pt on FM Coordination tasks, joint protection, safety with ADL/IADL tasks, reducing fall risk, graded return to activities with supervision. Pt verbalized understanding of all.  OT provided pt with additional copy of memory compensation strategies handout per 05/31/23 pt instructions.  Simulated medication management using small beads and medication organizer - to improve affected UE FM  coordination and dexterity, to improve sequencing for IADL tasks, to improve understanding of medication management strategies. Pt completed task with 100% accuracy. Associated goal updated, see below.  TherAct OT assessed pt's progress towards goals, see below for updates.   Standing with gait belt, supervision A - With RUE, Moving dumbbells (progressing from 1 lb up to 5 lb) at varying shelf heights (approx. 90* R shoulder flex, then 100* R shoulder flex, then 115* R shoulder flex) - to improve affected UE strengthening and coordination, to improve functional reach overhead for IADL tasks. Associated goal updated, see below.  PATIENT EDUCATION: Education details: see today's tx above Person educated: Patient Education method: Explanation, Demonstration, and Handouts Education comprehension: verbalized understanding and returned demonstration  HOME EXERCISE PROGRAM: 05/31/23 - Memory compensation strategies, Activities to keep thinking skills sharp (handouts, see pt instructions), green theraputty Access Code: [email or web address] 06/08/23 and 06/10/23 - Red Theraband and shoulder IR. Access Code: [email or web address]   GOALS: Goals reviewed with patient? Yes  SHORT TERM GOALS: Target date: 06/25/23  Patient will demonstrate initial RUE HEP with visual handouts and 25% verbal cues or less for proper execution.  Baseline: new to outpt OT Goal status: in progress  2.  Pt will recall or demo at least 3 memory compensation strategies. Baseline: Pt reported mild difficulty with word-finding and short-term memory recall. 06/14/23 - With min v/c, pt recalled write down items, use word associations, keeping items in same location and using consistent routine, using a basket by the door to keep important items. Goal status: MET  3.  Pt will demonstrate ability to retrieve a 3 lbs object from overhead shelf at 115*with RUE, demonstrating good control.  Baseline: Pt reported and demo'd decreased RUE grip strength, extra  time to achieve full shoulder ROM of RUE. 06/14/23 - Pt retrieved 5 lbs objects from overhead shelf with good control using RUE. Goal status: MET  4.  Pt will report improved efficiency and decreased fatigue when completing ADL LB dressing and IADL household management tasks, indicating improved activity tolerance.  Baseline: Pt reported extra time required for LB dressing and increased fatigue with IADL household management tasks. 06/14/23 - Pt reported still getting tired, taking break PRN "when I get tired." Goal status: in progress  LONG TERM GOALS: Target date: 07/23/23  Patient will ind demonstrate updated RUE HEP with visual handouts. Baseline: new to outpt OT Goal status: INITIAL  2.  Pt will demo at least 50 lbs (average of 3 trials) RUE grip strength as needed to carpentry tasks. Baseline: Grip strength: Right: 40, 29, 33 (34 lbs average) lbs; Left: 68, 71, 74 (71 lbs average) lbs 06/14/23 - Right: 75 lbs, 65 lbs, 81 lbs (73.6 lbs average) Goal status: MET  3.  Patient will demo improved FM coordination as evidenced by  completing nine-hole peg with use of RUE in 22 seconds or less.  Baseline: 9 Hole Peg test: Right: 24 sec; Left: 23 sec 06/14/23 - 9 hole peg test: Trial 1  - Right 22 sec with 1 drop. Trial 2 - Right 19 seconds with 0 drops. Goal status: MET  4.  Patient will demonstrate at least 16% improvement with quick Dash score (reporting 20.4% disability or less) indicating improved functional use of affected extremity.   Baseline: 36.4% deficit Goal status: in progress  5.  Pt will ind complete simple simulated medication management task with 100% accuracy.  Baseline: Pt's daughter assists pt with managing medications. 06/14/23 - Pt reported managing all medications at home and daughter assists with picking up prescriptions. Pt completed simulated medication management task with 100% accuracy. Goal status: MET  6.  Pt will report no more than moderate difficulty with  work-related carpentry tasks per QuickDASH. Baseline: Pt reported unable to complete carpentry tasks for work per [employer]. Goal status: in progress  ASSESSMENT:  CLINICAL IMPRESSION: Pt's BP higher today compared to previous sessions, see vital signs, and therefore OT closely monitored pt's BP throughout session. Pt asymptomatic throughout OT session. OT updated PT on BP readings today.    Pt tolerated tasks well. Pt met 2 STG and 3 LTG today, indicating good progress towards goals. Pt demo'd improved strength and FM coordination of affected UE. Pt would benefit from skilled OT services in the outpatient setting to work on impairments as noted below to help pt return to PLOF as able.   PERFORMANCE DEFICITS: in functional skills including ADLs, IADLs, coordination, dexterity, proprioception, ROM, strength, flexibility, Fine motor control, Gross motor control, mobility, balance, body mechanics, endurance, vision, and UE functional use, cognitive skills including attention, energy/drive, and memory, and psychosocial skills including environmental adaptation.   IMPAIRMENTS: are limiting patient from ADLs, IADLs, rest and sleep, work, leisure, and social participation.   CO-MORBIDITIES: may have co-morbidities  that affects occupational performance. Patient will benefit from skilled OT to address above impairments and improve overall function.  MODIFICATION OR ASSISTANCE TO COMPLETE EVALUATION: No modification of tasks or assist necessary to complete an evaluation.  OT OCCUPATIONAL PROFILE AND HISTORY: Detailed assessment: Review of records and additional review of physical, cognitive, psychosocial history related to current functional performance.  CLINICAL DECISION MAKING: Moderate - several treatment options, min-mod task modification necessary  REHAB POTENTIAL: Good  EVALUATION COMPLEXITY: Moderate    PLAN:  OT FREQUENCY: 2x/week  OT DURATION: 6 weeks (dates extended to allow for  scheduling)  PLANNED INTERVENTIONS: 40981 OT Re-evaluation, 97535 self care/ADL training, 19147 therapeutic exercise, 97530 therapeutic activity, 97112 neuromuscular re-education, 97140 manual therapy, 97035 ultrasound, 97018 paraffin, 82956 fluidotherapy, 97010 moist heat, 97010 cryotherapy, 97760 Orthotics management and training, 21308 Splinting (initial encounter), [id number] Subsequent splinting/medication, passive range of motion, functional mobility training, visual/perceptual remediation/compensation, energy conservation, patient/family education, and DME and/or AE instructions  RECOMMENDED OTHER SERVICES: PT and SLP evals completed  CONSULTED AND AGREED WITH PLAN OF CARE: Patient  PLAN FOR NEXT SESSION:  Monitor BP Review HEP - assess goals PRN Energy conservation strategies handout FM coordination activities and general strengthening of affected UE (e.g. Blaze Pods, Edwina Barth, etc.)  Wynetta Emery, OT 06/14/2023, 9:54 AM

## 2023-06-14 NOTE — Patient Instructions (Signed)
 Marland Kitchen

## 2023-06-14 NOTE — Therapy (Signed)
 OUTPATIENT PHYSICAL THERAPY NEURO TREATMENT   Patient Name: JONI NORROD MRN: [id number] DOB:[birth year], 58 y.o., male Today's Date: 06/14/2023   PCP: Warnell Forester, PA-C REFERRING PROVIDER: Warnell Forester, PA-C  END OF SESSION:  PT End of Session - 06/14/23 1014     Visit Number 9    Number of Visits 17    Date for PT Re-Evaluation 07/09/23    Authorization Type UHC    PT Start Time 1015    PT Stop Time 1054    PT Time Calculation (min) 39 min    Activity Tolerance Patient tolerated treatment well    Behavior During Therapy WFL for tasks assessed/performed                Past Medical History:  Diagnosis Date   ETOH abuse    Stroke [facility])    History reviewed. No pertinent surgical history. There are no active problems to display for this patient.   ONSET DATE: 05/11/23 referral  REFERRING DIAG:  H08.65 (ICD-10-CM) - Personal history of transient ischemic attack (TIA), and cerebral infarction without residual deficits  R53.1 (ICD-10-CM) - Weakness of right side of body    THERAPY DIAG:  Other lack of coordination  Muscle weakness (generalized)  Other abnormalities of gait and mobility  Unsteadiness on feet  Rationale for Evaluation and Treatment: Rehabilitation  SUBJECTIVE:                                                                                                                                                                                             SUBJECTIVE STATEMENT: Patient reports doing fair. Does have a HA 4/10. Did climb up 3 steps of ladder with adult son present- no issue, but did not reach over head. Denies falls.   Pt accompanied by: self  PERTINENT HISTORY: CVA, ETOH abuse  PAIN:  Are you having pain? Yes: NPRS scale: 4/10 Pain location: Frontal head/eyes Pain description: Headache/pressure   PRECAUTIONS: Fall   PATIENT GOALS: "to return to work"  OBJECTIVE:  Note: Objective measures were completed at Evaluation  unless otherwise noted.  DIAGNOSTIC FINDINGS: 05/02/23 brain MRI IMPRESSION: Normal examination. No abnormality seen to explain the clinical presentation.  VITALS  Vitals:   06/14/23 1019 06/14/23 1037  BP: (!) 133/90 (!) 136/91  Pulse: 78 (!) 108  TREATMENT: Theract: -modified burpees with Bosu ball touch down -> shoulder press  -> standing on Airex -> standing on foam balance beam  -standing on foam balance beam with 5# wrist weight balloon bat naming animals-> alphabetical foods -6" +8" step up reciprocal stepping   -30s, 1 min, 1 min  -increased evidence of R ankle instability with fatigue/eccentric lowering  -SLS on wobble board B LE 2x45s   PATIENT EDUCATION: Education details: SLS on pillow on solid floor with solid UE support added to HEP Person educated: Patient Education method: Explanation Education comprehension: verbalized understanding  HOME EXERCISE PROGRAM: Access Code: [email or web address] URL: [email or web address] Date: 05/17/2023 Prepared by: Merry Lofty  Exercises - Side Stepping with Resistance at Ankles  - 1 x daily - 7 x weekly - 3 sets - 10 reps - Forward Monster Walks  - 1 x daily - 7 x weekly - 3 sets - 10 reps - Backward Monster Walks  - 1 x daily - 7 x weekly - 3 sets - 10 reps - Runner's Step Up/Down  - 1 x daily - 7 x weekly - 3 sets - 10 reps - Lateral Step Ups  - 1 x daily - 7 x weekly - 3 sets - 10 reps - Push-Up on Counter  - 1 x daily - 7 x weekly - 3 sets - 10 reps - 1 second hold  GOALS: Goals reviewed with patient? Yes  SHORT TERM GOALS: = LTG based on PT POC length   LONG TERM GOALS: Target date: 06/11/23  Pt will be independent with final HEP for improved functional strength and mobility  Baseline: to be provided; provided Goal status: MET  2.  Pt will improve FGA to >/= 23/30 to  demonstrate improved balance and reduced fall risk  Baseline: 18/30, 23/30 Goal status: MET  3.  Patient will negotiate at least 12 steps with no hand rails, reciprocal pattern, independently to demonstrate improved eccentric control of R knee Baseline: 4 steps step-to and required B HR, 12 steps no HR + SBA and reciprocal pattern Goal status: IN PROGRESS  4.  Patient will lift at least 50 lbs from the ground to his waist to simulate work-related duties Baseline: unable; 40lbs with great difficulty Goal status: IN PROGRESS   NEW LONG TERM GOALS: 07/09/23   1. Pt will be independent with final HEP for improved functional strength/endurance Baseline: to be updated Goal status: NEW  2.  Patient will lift at least 50 lbs from the ground to his waist to simulate work-related duties Baseline: 40lbs with great difficulty Goal status: NEW  3. Patient will be able to balance on simulated ladder step with B LE while completing overhead manual task with item weighing at least 5lbs for at least 1 min with supervision  Baseline: unable  Goal status: NEW     ASSESSMENT:  CLINICAL IMPRESSION: Patient seen for skilled PT session with emphasis on continuing dual tasking and higher level balance tasks with goal of returning to work related tasks. Patient demonstrating significant improvement in ability to complete dual motor + cog task with level mental fatigue toward end of task compared to previous session. R LE does continue to fatigue with any reasonable duration of balance and power tasks limiting his ability to safely return to work. Continue POC.    OBJECTIVE IMPAIRMENTS: Abnormal gait, decreased balance, decreased coordination, decreased knowledge of condition, decreased strength, impaired sensation, and impaired UE functional use.   ACTIVITY LIMITATIONS: carrying, lifting, bending, squatting, stairs,  reach over head, locomotion level, and caring for others  PARTICIPATION LIMITATIONS:  meal prep, cleaning, interpersonal relationship, driving, shopping, community activity, occupation, and yard work  PERSONAL FACTORS: Age, Fitness, Profession, Time since onset of injury/illness/exacerbation, and Transportation are also affecting patient's functional outcome.   REHAB POTENTIAL: Good  CLINICAL DECISION MAKING: Stable/uncomplicated  EVALUATION COMPLEXITY: Low  PLAN:  PT FREQUENCY: 2x/week 2x/ week (re-cert)  PT DURATION: 4 weeks 4 weeks (re-cert)  PLANNED INTERVENTIONS: 86578- PT Re-evaluation, 97110-Therapeutic exercises, 97530- Therapeutic activity, 97112- Neuromuscular re-education, 97535- Self Care, 46962- Manual therapy, [phone]- Gait training, [phone]- Aquatic Therapy, Patient/Family education, Balance training, Stair training, Dry Needling, Vestibular training, Visual/preceptual remediation/compensation, and DME instructions  PLAN FOR NEXT SESSION: high level balance, R LE eccentric control, heavy lifting, CHECK BP, return to work tasks (overhead, climbing higher stairs), basketball, work w/power tools and imitate ladders/step stools    Westley Foots, PT Westley Foots, PT, DPT, CBIS  06/14/2023, 11:00 AM

## 2023-06-14 NOTE — Therapy (Signed)
 OUTPATIENT SPEECH LANGUAGE PATHOLOGY TREATMENT   Patient Name: Derrick Houston MRN: [id number] DOB:1965/11/21, 58 y.o., male Today's Date: 06/14/2023  PCP: Derrick Mort PA-C REFERRING PROVIDER: Arma Houston (Doc sent to PCP)  END OF SESSION:  End of Session - 06/14/23 0931     Visit Number 3    Number of Visits 17    Date for SLP Re-Evaluation 07/26/23    Authorization Type UHC    SLP Start Time 0930    SLP Stop Time  1015    SLP Time Calculation (min) 45 min    Activity Tolerance Patient tolerated treatment well              Past Medical History:  Diagnosis Date   ETOH abuse    Stroke [facility])    History reviewed. No pertinent surgical history. There are no active problems to display for this patient.   ONSET DATE: 05/21/23   REFERRING DIAG: I63.9 (ICD-10-CM) - CVA (cerebral vascular accident)  THERAPY DIAG:  Aphasia  Dysarthria and anarthria  Rationale for Evaluation and Treatment: Rehabilitation  SUBJECTIVE:   SUBJECTIVE STATEMENT: Pt reported reminding family members not to "finish his sentences" per discussion at initial evaluation involving cuing hierarchy.  Pt accompanied by: self  PERTINENT HISTORY:  CVA, ETOH abuse, marijuana use, HTN, headache, prediabetes   PAIN:  Are you having pain? Yes, slight headache  FALLS: Has patient fallen in last 6 months?  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  DIAGNOSTIC FINDINGS: 05/02/23 brain MRI Normal examination. No abnormality seen to explain the clinical presentation.  COGNITION: Overall cognitive status: Within functional limits for tasks assessed  AUDITORY COMPREHENSION: Overall auditory comprehension:  Appears intact YES/NO questions: Appears intact Following directions: Appears intact Conversation: Moderately Complex Interfering components: motor planning and processing speed Effective technique: extra processing time per patient report when conversing at home  EXPRESSION: verbal  VERBAL EXPRESSION: Level of generative/spontaneous verbalization: word, phrase, sentence, and conversation Automatic speech: name: intact and social response: intact  Repetition: Appears intact Naming: Confrontation: 76-100% Pragmatics: Appears intact Comments: Pt complains of slower rate of speech Effective technique: semantic cues and written cues Non-verbal means of communication: N/A  WRITTEN EXPRESSION: Dominant hand: right  MOTOR SPEECH: Overall motor speech: impaired Level of impairment: Sentence and Conversation Respiration: thoracic breathing Phonation: normal Resonance: WFL Articulation: Impaired: conversation Intelligibility: Intelligibility reduced Motor planning: Impaired: aware, slowed speech Motor speech errors: aware and inconsistent Effective technique: slow rate and over articulate  ORAL MOTOR EXAMINATION: Overall status: Impaired:   Lingual: Bilateral (ROM and Coordination) Comments: Reduced range of motion impacting speech  STANDARDIZED ASSESSMENTS: BNT: 59/60  PATIENT REPORTED OUTCOME MEASURES (PROM): Communication Participation Item Bank: 18  The Communicative Participation Item Bank        Does your condition interfere with... Pt Rating   ...talking with people you know 2   ...communicating when you need to say something quickly 2   ...talking with people you do not know 2   ...communicating when you are out in your community 2   ...asking questions in a conversation 2   ....communicating in a small group of people 2   ...having a long conversation 2   ...giving detailed infomrmation 2   .Marland KitchenMarland Kitchen  getting your turn in a fast moving conversation 1   ...trying to  persuade a friend or family member to see a different point of view 1  3= Not at all; 2=A little; 1=Quite a bit; 0=Very much                                                                                                                             TREATMENT DATE:   Next session: energy conservation  06/14/23: Reviewed strategies for dysarthria - Derrick Houston demonstrated slow rate, over articulation with rare min A to supervision generating sentences with multi-syllabic words with no slur 10/10 sentences. Targeted carryover of compensatory strategies for dysarthria debating paying college athletes and DOGE - 1 episode of word finding, Derrick Houston use circumlocution to compensate for aphasia. Targeted word finding in complex naming task Derrick Houston generated words for 15 categories with a given letter with min extended time and rare min semantic cues.   06/08/23: SLP initiated pt education of dysarthria strategies. Pt confirming understanding and planning to exercise in conversation at home.  SLP implemented category naming language task prompting pt to implement dysarthria strategies throughout task and in side conversation to target anomia and cohesive, clear speech. Pt periodically used successfully to improve intelligibility with min-A. Pt reports his speech is most unintelligible when he is tired, or in the evening. SLP suggesting consistent pt practice using dysarthria techniques and implementing strategies during these moments in conversation. SLP then initiated language task teaching semantic feature analysis (SFA) with pt participating effectively given min-A. D/t pt reports of increased difficulty with anomia in debates with family members and or when excited, SLP plans to implement language tasks mirroring scenarios impacting pt with increased complexity to continue targeting anomia and dysarthric speech.  05/31/23: SLP initiated education and instruction of anomia strategies, with handout provided.  Introduced Chartered loss adjuster for family to utilize at home versus providing targeted word. Pt verbalized understanding and agreement.    PATIENT EDUCATION: Education details: See above Person educated: Patient Education method: Explanation Education comprehension: verbalized understanding and needs further education   GOALS: Goals reviewed with patient? Yes  SHORT TERM GOALS: Target date: 06/28/2023  Pt will demonstrate word finding strategies for anomia with 80% accuracy during structured task given min A.   Baseline: Goal status: ONGOING  2.  Pt will demonstrate strategies for dysarthria with 80% accuracy during a structured task with min A. Baseline:  Goal status: ONGOING  3.  Pt will complete HEP 5/7 days over 2 week period  Baseline:  Goal status: ONGOING  4.  Pt will complete structured language tasks with 80% accuracy given min A  Baseline:  Goal status: ONGOING   LONG TERM GOALS: Target date: 07/26/2023  Pt will report improved communication effectiveness via PROM by 2 points by LTG date   Baseline: CPIB=18 Goal status: ONGOING  2. Pt will utilize anomia strategies in 80% of opportunities in conversation over 2 sessions with mod I  Baseline:  Goal status: ONGOING  3.  Pt will be independent with HEP and report consistent carryover of strategies. Baseline:  Goal status: ONGOING  4.  Pt will utilize dysarthria strategies to achieve clear, intelligible speech in 95% of opportunities over 2 sessions with mod I Baseline:  Goal status: ONGOING   ASSESSMENT:  CLINICAL IMPRESSION: Patient is a 58 y.o. man who was seen today for mild anomia and dysarthria following a TIA/CVA. Pt presents with occasional word finding deficits scoring 59/60 on the [employer] (BNT) with intermittent extra time, however, pt reports he primarily experiences word retrieval difficulty when engaging in exciting/emotionally charged conversation at home, and reports this deficit impacts  his communication on a regular basis. Pt also expressed concerns regarding "slow speech" and knowing what he wants to communicate, but feeling unable to say it with the rate of speech he maintained prior to the stroke. Pt's speech is characterized by imprecise articulation of words at the sentence and conversational level, and reduced rate of speech. Pt continues to benefit from skilled ST to address the aforementioned deficits and improve pt's functional communication, speech and QoL.  OBJECTIVE IMPAIRMENTS: include aphasia and dysarthria These impairments are limiting patient from effectively communicating at home and in community. Factors affecting potential to achieve goals and functional outcome are  none . Patient will benefit from skilled SLP services to address above impairments and improve overall function.  REHAB POTENTIAL: Good  PLAN:  SLP FREQUENCY: 2x/week  SLP DURATION: 8 weeks  PLANNED INTERVENTIONS: Language facilitation, Environmental controls, Cueing hierachy, Internal/external aids, Functional tasks, Multimodal communication approach, SLP instruction and feedback, Compensatory strategies, Patient/family education, [phone] Treatment of speech (30 or 45 min) , and 93235- Speech Eval Sound Prod, Artic, Phon, Eval Compre, Express   Evaluation and documentation in part completed by graduate clinician, Debby Bud, and supervised by Zena Amos.    Astin Rape, Radene Journey, CCC-SLP 06/14/2023, 10:11 AM

## 2023-06-17 ENCOUNTER — Telehealth: Payer: Self-pay

## 2023-06-17 ENCOUNTER — Ambulatory Visit: Payer: 59 | Admitting: Occupational Therapy

## 2023-06-17 ENCOUNTER — Ambulatory Visit: Payer: 59

## 2023-06-17 NOTE — Telephone Encounter (Signed)
 This therapist contacted the patient at this time 0903 on this date 06/17/23. And LVM. This is the patients 1st no show for this POC. Explained no show/ same-day cancel policy to patient. Confirmed patients next appointment time 3/6 starting at 845 for OT, the ST and PT.

## 2023-06-21 ENCOUNTER — Ambulatory Visit: Payer: 59 | Admitting: Speech Pathology

## 2023-06-21 ENCOUNTER — Ambulatory Visit: Payer: 59

## 2023-06-21 ENCOUNTER — Encounter: Payer: Self-pay | Admitting: Speech Pathology

## 2023-06-21 ENCOUNTER — Ambulatory Visit: Payer: 59 | Admitting: Occupational Therapy

## 2023-06-21 VITALS — BP 142/94 | HR 79

## 2023-06-21 VITALS — BP 136/92 | HR 73

## 2023-06-21 DIAGNOSIS — R2681 Unsteadiness on feet: Secondary | ICD-10-CM

## 2023-06-21 DIAGNOSIS — M6281 Muscle weakness (generalized): Secondary | ICD-10-CM

## 2023-06-21 DIAGNOSIS — R278 Other lack of coordination: Secondary | ICD-10-CM

## 2023-06-21 DIAGNOSIS — R471 Dysarthria and anarthria: Secondary | ICD-10-CM

## 2023-06-21 DIAGNOSIS — R29818 Other symptoms and signs involving the nervous system: Secondary | ICD-10-CM

## 2023-06-21 DIAGNOSIS — R4701 Aphasia: Secondary | ICD-10-CM

## 2023-06-21 DIAGNOSIS — R2689 Other abnormalities of gait and mobility: Secondary | ICD-10-CM

## 2023-06-21 DIAGNOSIS — R29898 Other symptoms and signs involving the musculoskeletal system: Secondary | ICD-10-CM

## 2023-06-21 NOTE — Therapy (Signed)
 OUTPATIENT OCCUPATIONAL THERAPY NEURO Treatment  Patient Name: Derrick Houston MRN: [id number] DOB:07-Sep-1965, 58 y.o., male Today's Date: 06/21/2023  PCP: Modesta Messing  REFERRING PROVIDER: Arma Heading   END OF SESSION:  OT End of Session - 06/21/23 0939     Visit Number 6    Number of Visits 13    Date for OT Re-Evaluation 07/23/23    Authorization Type UHC 2025, no auth required, VL: 90 combined    OT Start Time 0846    OT Stop Time 0924    OT Time Calculation (min) 38 min    Activity Tolerance Patient tolerated treatment well    Behavior During Therapy WFL for tasks assessed/performed                  Past Medical History:  Diagnosis Date   ETOH abuse    Stroke (HCC)    No past surgical history on file. There are no active problems to display for this patient.   ONSET DATE: 05/21/23 (referral date)  REFERRING DIAG: I63.9 (ICD-10-CM) - Impending cerebrovascular accident (HCC)   THERAPY DIAG:  Muscle weakness (generalized)  Other lack of coordination  Other symptoms and signs involving the nervous system  Other symptoms and signs involving the musculoskeletal system  Rationale for Evaluation and Treatment: Rehabilitation  SUBJECTIVE:   SUBJECTIVE STATEMENT: Pt reported symptoms "like a migraine" Thursday 3/6 with nausea and therefore unable to come to therapy sessions. Pt reported continuing to feel unwell all weekend though feels "okay" today. Pt reported continued headache pain and denies nausea today. Pt reported visit to Neurologist on 06/16/23 though unsure if changes to medication may be impacting nausea.  Pt accompanied by: self  PERTINENT HISTORY: CVA, ETOH abuse, marijuana use, HTN, headache, prediabetes  05/02/23 brain MRI IMPRESSION: Normal examination. No abnormality seen to explain the clinical presentation.  PRECAUTIONS: Fall  WEIGHT BEARING RESTRICTIONS: No  PAIN:  Are you having pain? 4-5/10 headache  FALLS:  Has patient fallen in last 6 months? No,   06/08/23 update: Pt reported near-fall on Friday 06/04/23 when painting a wall in home while standing on a stepstool, pt reported off-balance though recovered ind. Pt reported completing task though "it was tough."  LIVING ENVIRONMENT:  Lives with: lives with their family Lives in: House/apartment Stairs: Yes: Internal: 2 steps; on right going up Has following equipment at home: Single point cane and Grab bars  PLOF: ind, Occupation: carpentry, currently driving  PATIENT GOALS: "to get back to work"  OBJECTIVE:  Note: Objective measures were completed at Evaluation unless otherwise noted.  HAND DOMINANCE: Right  ADLs: Overall ADLs: ind Transfers/ambulation related to ADLs: ind Eating: ind, including cutting with knife Grooming: ind UB Dressing: ind, no difficulty zippers/buttons LB Dressing: ind with extra time, no difficulty tying shoes Toileting: ind Bathing: ind, some difficulty reaching behind back with R hand though can manage with extra time Tub Shower transfers: ind Equipment: Grab bars and Walk in shower  IADLs: Shopping: ind, some assistance from family, some difficulty carrying heavy cases of water d/t weakness of R side Light housekeeping: ind vacuuming, laundry, dishwashing, sweeping - some fatigue with sweeping and vacuuming "I don't have the stamina I used to have." Meal Prep: often orders fast food currently, different from before the stroke. Pt reported increased difficulty with meal prep. Community mobility: currently driving, some help from family for errands Medication management: 05/31/23 update - Pt takes medication ind as prescribed, daughter helps retrieve medication from pharmacy.  Handwriting:  Pt reported slower speed of handwriting.  Work-related tasks: Pt reported unable to complete work-related carpentry tasks, such as difficulty driving nail using hammer with RUE.  MOBILITY STATUS: Independent  POSTURE  COMMENTS:  Ind sitting balance  ACTIVITY TOLERANCE: Activity tolerance: increased fatigue during the day. "I don't have the stamina I used to have."  FUNCTIONAL OUTCOME MEASURES: Quick Dash: 36.4% deficit   06/21/23 - 38.6% deficit.   UPPER EXTREMITY ROM:    Active ROM Right eval Left eval  Shoulder flexion WFL with extra time [facility]  Shoulder abduction WFL with extra time, v/c to breathe d/t pt holding breath WFL  Shoulder adduction WFL with extra time [facility]  Shoulder extension    Shoulder internal rotation    Shoulder external rotation    Elbow flexion    Elbow extension    Wrist flexion    Wrist extension    Wrist ulnar deviation    Wrist radial deviation    Wrist pronation    Wrist supination    (Blank rows = not tested)  UPPER EXTREMITY MMT:     MMT Right eval Left eval  Shoulder flexion 3+ 5  Shoulder abduction    Shoulder adduction    Shoulder extension    Shoulder internal rotation    Shoulder external rotation    Middle trapezius    Lower trapezius    Elbow flexion    Elbow extension    Wrist flexion    Wrist extension    Wrist ulnar deviation    Wrist radial deviation    Wrist pronation    Wrist supination    (Blank rows = not tested)  HAND FUNCTION: Grip strength: Right: 40, 29, 33 (34 lbs average) lbs; Left: 68, 71, 74 (71 lbs average) lbs  Pt benefited from v/c to breathe during task d/t pt tending to hold breath during task.  COORDINATION: 9 Hole Peg test: Right: 24 sec; Left: 23 sec  Pt reported coordination is "off a little bit." Pt reported difficulty using TV remote. Pt reported dropping phone several times this week.  SENSATION: Pt denied tingling/numbness in BUE. Pt reported tingling in R foot "front half towards the toes. "  EDEMA: none noted  MUSCLE TONE: RUE: Within functional limits and LUE: Within functional limits  COGNITION: Overall cognitive status: Within functional limits for tasks assessed  Pt reported some  difficulty remembering all seasoning and other ingredients when preparing a meal though recalls ingredients with extra time. Pt reported "I can't get my words and everything to line up." Pt reported some difficulty with word-finding. Pt reported some very mild slurred speech later in the evening. Pt reported difficulties with short-term memory.  OT noted slightly increased time for pt to respond though pt answered questions appropriately.  Sunshine, pencil, bed: Pt recalled 3/3 words after approx. 3 minutes.  VISION: Subjective report: Pt reported lights "mess with my eyes at night" when driving and when riding in vehicle. Pt accurately read clock on wall. Baseline vision: Wears glasses for reading only Visual history:  none noted  VISION ASSESSMENT: Tracking: Visual tracking to all 4 quadrants, no concerns noted.  PERCEPTION: Not tested  PRAXIS: Not tested  OBSERVATIONS: Pt ambulated ind without A/E. Pt was pleasant and appeared well-kept.  TREATMENT DATE:     Self-Care Vital signs assessed and monitored throughout session, see vital signs. OT educated pt on BP parameters. Pt verbalized understanding. Pt denied symptoms of dizziness/lightheadedness throughout session.  Pt reported completing HEP, no questions/concerns at this time.  OT educated pt on Energy conservation. Handout provided, see pt instructions. Pt acknowledged understanding.  OT educated pt on Deep breathing strategies. Handout provided, see pt instructions. Increased v/c required for diaphragmatic breathing. Pt returned demo.   OT educated pt on safety when returning to daily activities, strategies to reduce fall risk, dx and prognosis, A/E option of use of long-handled sponge for bathing tasks. Pt verbalized understanding of all. Pt reported feeling stronger when using RUE for daily tasks.    TherAct Flex bar - supination, pronation, twist - 10 reps, 2 sets each - to improve affected UE strengthening and gross motor coordination.  Blaze Pods - OT placed 6 BlazePods in front of patient and had patient tap pods with palm of hand and isolated index finger as pods lit up using OT Distraction and Scanning mode forfine motor coordination, gross motor coordination, reaction time, scanning and locating of items, processing, motor planning, and endurance/stamina.   Hits were as follows:  RUE fingertips: 99 hits for 2 min duration and 1.02 sec reaction time. RUE isolated index finger: 100 hits for 2 min duration and 1.02 sec reaction time.  OT assessed pt's progress towards goals, see below for updates.   PATIENT EDUCATION: Education details: see today's tx above Person educated: Patient Education method: Explanation, Demonstration, and Handouts Education comprehension: verbalized understanding and returned demonstration  HOME EXERCISE PROGRAM: 05/31/23 - Memory compensation strategies, Activities to keep thinking skills sharp (handouts, see pt instructions), green theraputty Access Code: [email or web address] 06/08/23 and 06/10/23 - Red Theraband and shoulder IR. Access Code: [facility] 06/21/23 - energy conservation handout   GOALS: Goals reviewed with patient? Yes  SHORT TERM GOALS: Target date: 06/25/23  Patient will demonstrate initial RUE HEP with visual handouts and 25% verbal cues or less for proper execution.  Baseline: new to outpt OT Goal status: in progress  2.  Pt will recall or demo at least 3 memory compensation strategies. Baseline: Pt reported mild difficulty with word-finding and short-term memory recall. 06/14/23 - With min v/c, pt recalled write down items, use word associations, keeping items in same location and using consistent routine, using a basket by the door to keep important items. Goal status: MET  3.  Pt will demonstrate ability to retrieve a 3 lbs object from  overhead shelf at 115*with RUE, demonstrating good control.  Baseline: Pt reported and demo'd decreased RUE grip strength, extra time to achieve full shoulder ROM of RUE. 06/14/23 - Pt retrieved 5 lbs objects from overhead shelf with good control using RUE. Goal status: MET  4.  Pt will report improved efficiency and decreased fatigue when completing ADL LB dressing and IADL household management tasks, indicating improved activity tolerance.  Baseline: Pt reported extra time required for LB dressing and increased fatigue with IADL household management tasks. 06/14/23 - Pt reported still getting tired, taking break PRN "when I get tired." Goal status: in progress  LONG TERM GOALS: Target date: 07/23/23  Patient will ind demonstrate updated RUE HEP with visual handouts. Baseline: new to outpt OT Goal status: in progress  2.  Pt will demo at least 50 lbs (average of 3 trials) RUE grip strength as needed to carpentry tasks. Baseline: Grip strength: Right: 40, 29, 33 (34 lbs  average) lbs; Left: 68, 71, 74 (71 lbs average) lbs 06/14/23 - Right: 75 lbs, 65 lbs, 81 lbs (73.6 lbs average) Goal status: MET  3.  Patient will demo improved FM coordination as evidenced by completing nine-hole peg with use of RUE in 22 seconds or less.  Baseline: 9 Hole Peg test: Right: 24 sec; Left: 23 sec 06/14/23 - 9 hole peg test: Trial 1  - Right 22 sec with 1 drop. Trial 2 - Right 19 seconds with 0 drops. Goal status: MET  4.  Patient will demonstrate at least 16% improvement with quick Dash score (reporting 20.4% disability or less) indicating improved functional use of affected extremity.   Baseline: 36.4% deficit 06/21/23 - 38.6% deficit. Goal status: in progress  5.  Pt will ind complete simple simulated medication management task with 100% accuracy.  Baseline: Pt's daughter assists pt with managing medications. 06/14/23 - Pt reported managing all medications at home and daughter assists with picking up  prescriptions. Pt completed simulated medication management task with 100% accuracy. Goal status: MET  6.  Pt will report no more than moderate difficulty with work-related carpentry tasks per QuickDASH. Baseline: Pt reported unable to complete carpentry tasks for work per [employer]. Goal status: in progress  ASSESSMENT:  CLINICAL IMPRESSION: Pt tolerated tasks well. Pt maintained QuickDASH score though pt reported feeling stronger when using RUE during daily tasks. Continue POC. Pt would benefit from skilled OT services in the outpatient setting to work on impairments as noted below to help pt return to PLOF as able.   PERFORMANCE DEFICITS: in functional skills including ADLs, IADLs, coordination, dexterity, proprioception, ROM, strength, flexibility, Fine motor control, Gross motor control, mobility, balance, body mechanics, endurance, vision, and UE functional use, cognitive skills including attention, energy/drive, and memory, and psychosocial skills including environmental adaptation.   IMPAIRMENTS: are limiting patient from ADLs, IADLs, rest and sleep, work, leisure, and social participation.   CO-MORBIDITIES: may have co-morbidities  that affects occupational performance. Patient will benefit from skilled OT to address above impairments and improve overall function.  MODIFICATION OR ASSISTANCE TO COMPLETE EVALUATION: No modification of tasks or assist necessary to complete an evaluation.  OT OCCUPATIONAL PROFILE AND HISTORY: Detailed assessment: Review of records and additional review of physical, cognitive, psychosocial history related to current functional performance.  CLINICAL DECISION MAKING: Moderate - several treatment options, min-mod task modification necessary  REHAB POTENTIAL: Good  EVALUATION COMPLEXITY: Moderate    PLAN:  OT FREQUENCY: 2x/week  OT DURATION: 6 weeks (dates extended to allow for scheduling)  PLANNED INTERVENTIONS: 16109 OT Re-evaluation, 97535  self care/ADL training, 60454 therapeutic exercise, 97530 therapeutic activity, 97112 neuromuscular re-education, 97140 manual therapy, 97035 ultrasound, 97018 paraffin, 09811 fluidotherapy, 97010 moist heat, 97010 cryotherapy, 97760 Orthotics management and training, 91478 Splinting (initial encounter), [id number] Subsequent splinting/medication, passive range of motion, functional mobility training, visual/perceptual remediation/compensation, energy conservation, patient/family education, and DME and/or AE instructions  RECOMMENDED OTHER SERVICES: PT and SLP evals completed  CONSULTED AND AGREED WITH PLAN OF CARE: Patient  PLAN FOR NEXT SESSION:  Monitor BP Review HEP - assess goals PRN FM coordination activities and general strengthening of affected UE (e.g. Blaze Pods, Edwina Barth, etc.)  Wynetta Emery, OT 06/21/2023, 9:46 AM

## 2023-06-21 NOTE — Therapy (Signed)
 OUTPATIENT PHYSICAL THERAPY NEURO TREATMENT/ 10th VISIT PROGRESS NOTE   Patient Name: Derrick Houston MRN: [id number] DOB:05/01/1965, 58 y.o., male Today's Date: 06/21/2023   PCP: Warnell Forester, PA-C REFERRING PROVIDER: Warnell Forester, PA-C  Physical Therapy Progress Note   Dates of Reporting Period:05/12/23-06/21/23  See Note below for Objective Data and Assessment of Progress/Goals.  Thank you for the referral of this patient. Westley Foots, PT, DPT, CBIS   END OF SESSION:  PT End of Session - 06/21/23 0909     Visit Number 10    Number of Visits 17    Date for PT Re-Evaluation 07/09/23    Authorization Type UHC    PT Start Time 1013    PT Stop Time 1055    PT Time Calculation (min) 42 min    Equipment Utilized During Treatment Gait belt    Activity Tolerance Patient tolerated treatment well    Behavior During Therapy WFL for tasks assessed/performed                Past Medical History:  Diagnosis Date   ETOH abuse    Stroke [facility])    History reviewed. No pertinent surgical history. There are no active problems to display for this patient.   ONSET DATE: 05/11/23 referral  REFERRING DIAG:  Y86.57 (ICD-10-CM) - Personal history of transient ischemic attack (TIA), and cerebral infarction without residual deficits  R53.1 (ICD-10-CM) - Weakness of right side of body    THERAPY DIAG:  Other lack of coordination  Muscle weakness (generalized)  Other abnormalities of gait and mobility  Unsteadiness on feet  Rationale for Evaluation and Treatment: Rehabilitation  SUBJECTIVE:                                                                                                                                                                                             SUBJECTIVE STATEMENT: Patient reports doing fair. Had a really bad migraine on Thurs/fri and was throwing up, despite MD increasing rx dose. Denies falls.   Pt accompanied by:  self  PERTINENT HISTORY: CVA, ETOH abuse  PAIN:  Are you having pain? Yes: NPRS scale: 4/10 Pain location: Frontal head/eyes Pain description: Headache/pressure   PRECAUTIONS: Fall   PATIENT GOALS: "to return to work"  OBJECTIVE:  Note: Objective measures were completed at Evaluation unless otherwise noted.  DIAGNOSTIC FINDINGS: 05/02/23 brain MRI IMPRESSION: Normal examination. No abnormality seen to explain the clinical presentation.  VITALS  Vitals:   06/21/23 1019  BP: (!) 136/92  Pulse: 73  TREATMENT: NMR: -R ankle 4-ways green theraband  -seated R ankle alphabet  -SLS on dynadisc B LE intermittent UE support on ballet bar -lateral/posterior lunge on Airex intermittent UE support on ballet bar -goblet squat + upright row 30lbs, progressed to 40lbs   Self care/home management: -return to work, benefit of FCE  PATIENT EDUCATION: Education details: see above, continue HEP Person educated: Patient Education method: Explanation Education comprehension: verbalized understanding  HOME EXERCISE PROGRAM: Access Code: [email or web address] URL: [email or web address] Date: 05/17/2023 Prepared by: Merry Lofty  Exercises - Side Stepping with Resistance at Ankles  - 1 x daily - 7 x weekly - 3 sets - 10 reps - Forward Monster Walks  - 1 x daily - 7 x weekly - 3 sets - 10 reps - Backward Monster Walks  - 1 x daily - 7 x weekly - 3 sets - 10 reps - Runner's Step Up/Down  - 1 x daily - 7 x weekly - 3 sets - 10 reps - Lateral Step Ups  - 1 x daily - 7 x weekly - 3 sets - 10 reps - Push-Up on Counter  - 1 x daily - 7 x weekly - 3 sets - 10 reps - 1 second hold  GOALS: Goals reviewed with patient? Yes  SHORT TERM GOALS: = LTG based on PT POC length   LONG TERM GOALS: Target date: 06/11/23  Pt will be independent with final HEP for  improved functional strength and mobility  Baseline: to be provided; provided Goal status: MET  2.  Pt will improve FGA to >/= 23/30 to demonstrate improved balance and reduced fall risk  Baseline: 18/30, 23/30 Goal status: MET  3.  Patient will negotiate at least 12 steps with no hand rails, reciprocal pattern, independently to demonstrate improved eccentric control of R knee Baseline: 4 steps step-to and required B HR, 12 steps no HR + SBA and reciprocal pattern Goal status: IN PROGRESS  4.  Patient will lift at least 50 lbs from the ground to his waist to simulate work-related duties Baseline: unable; 40lbs with great difficulty Goal status: IN PROGRESS   NEW LONG TERM GOALS: 07/09/23   1. Pt will be independent with final HEP for improved functional strength/endurance Baseline: to be updated Goal status: NEW  2.  Patient will lift at least 50 lbs from the ground to his waist to simulate work-related duties Baseline: 40lbs with great difficulty Goal status: NEW  3. Patient will be able to balance on simulated ladder step with B LE while completing overhead manual task with item weighing at least 5lbs for at least 1 min with supervision  Baseline: unable  Goal status: NEW     ASSESSMENT:  CLINICAL IMPRESSION: Patient seen for skilled PT session with emphasis on R ankle stability and anticipation of return to work. He tolerated SLS tasks well with improved R ankle stability noted- however it remains unequal to L ankle and on compliant surfaces it does increase his risk for falling. Discussed with patient that, physically, he may be okay to return to work at a slower pace due to impaired endurance. However, PT cannot speak to patients migraines as they remain uncontrolled. Patient verbalized understanding. Continue POC.    OBJECTIVE IMPAIRMENTS: Abnormal gait, decreased balance, decreased coordination, decreased knowledge of condition, decreased strength, impaired sensation,  and impaired UE functional use.   ACTIVITY LIMITATIONS: carrying, lifting, bending, squatting, stairs, reach over head, locomotion level, and caring for others  PARTICIPATION LIMITATIONS: meal prep, cleaning, interpersonal  relationship, driving, shopping, community activity, occupation, and yard work  PERSONAL FACTORS: Age, Fitness, Profession, Time since onset of injury/illness/exacerbation, and Transportation are also affecting patient's functional outcome.   REHAB POTENTIAL: Good  CLINICAL DECISION MAKING: Stable/uncomplicated  EVALUATION COMPLEXITY: Low  PLAN:  PT FREQUENCY: 2x/week 2x/ week (re-cert)  PT DURATION: 4 weeks 4 weeks (re-cert)  PLANNED INTERVENTIONS: 82956- PT Re-evaluation, 97110-Therapeutic exercises, 97530- Therapeutic activity, 97112- Neuromuscular re-education, 97535- Self Care, 21308- Manual therapy, [phone]- Gait training, [phone]- Aquatic Therapy, Patient/Family education, Balance training, Stair training, Dry Needling, Vestibular training, Visual/preceptual remediation/compensation, and DME instructions  PLAN FOR NEXT SESSION: high level balance, R LE eccentric control, heavy lifting, CHECK BP, return to work tasks (overhead, climbing higher stairs), basketball, work w/power tools and imitate ladders/step stools    Westley Foots, PT Westley Foots, PT, DPT, CBIS  06/21/2023, 12:22 PM

## 2023-06-21 NOTE — Therapy (Signed)
 OUTPATIENT SPEECH LANGUAGE PATHOLOGY TREATMENT   Patient Name: Derrick Houston MRN: [id number] DOB:10-20-1965, 58 y.o., male Today's Date: 06/21/2023  PCP: Barnie Mort PA-C REFERRING PROVIDER: Arma Heading (Doc sent to PCP)  END OF SESSION:  End of Session - 06/21/23 0932     Visit Number 4    Number of Visits 17    Date for SLP Re-Evaluation 07/26/23    Authorization Type UHC    SLP Start Time 0930    SLP Stop Time  1015    SLP Time Calculation (min) 45 min    Activity Tolerance Patient tolerated treatment well              Past Medical History:  Diagnosis Date   ETOH abuse    Stroke [facility])    History reviewed. No pertinent surgical history. There are no active problems to display for this patient.   ONSET DATE: 05/21/23   REFERRING DIAG: I63.9 (ICD-10-CM) - CVA (cerebral vascular accident)  THERAPY DIAG:  Aphasia  Dysarthria and anarthria  Rationale for Evaluation and Treatment: Rehabilitation  SUBJECTIVE:   SUBJECTIVE STATEMENT: Pt reported reminding family members not to "finish his sentences" per discussion at initial evaluation involving cuing hierarchy.  Pt accompanied by: self  PERTINENT HISTORY:  CVA, ETOH abuse, marijuana use, HTN, headache, prediabetes   PAIN:  Are you having pain? Yes, slight headache  FALLS: Has patient fallen in last 6 months?  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 He is carrying over compensatory strategies for dysarthria with mod I. He is carrying over verbal compensations for aphasia with mod I. He  completed complex naming task with 95% accuracy and rare min A. Almir participated in complex conversation with no word finding difficulties. He reports he successfully debated his son this weekend re: women's basketball. Educated in energy conservation strategies and management of high sensory environments. Kennie endorsed sensitivity to noise at his child's dance competition - he took breaks and rests in his car as needed. He also endorses getting "cranky" when fatigued or over stimulated. He is using energy conservation successfully at home. Goals met, d/c ST - pt is in agreement. He improved score on PROM from 18 to 22 points. Dysarthria not present today - he continues to endorse slur when fatigued at end of day   06/14/23: Reviewed strategies for dysarthria - [name] demonstrated slow rate, over articulation with rare min A to supervision generating sentences with multi-syllabic words with no slur 10/10 sentences. Targeted carryover of  compensatory strategies for dysarthria debating paying college athletes and DOGE - 1 episode of word finding, Omarrion use circumlocution to compensate for aphasia. Targeted word finding in complex naming task [name] generated words for 15 categories with a given letter with min extended time and rare min semantic cues.   06/08/23: SLP initiated pt education of dysarthria strategies. Pt confirming understanding and planning to exercise in conversation at home.  SLP implemented category naming language task prompting pt to implement dysarthria strategies throughout task and in side conversation to target anomia and cohesive, clear speech. Pt periodically used successfully to improve intelligibility with min-A. Pt reports his speech is most unintelligible when he is tired, or in the evening. SLP suggesting consistent pt practice using dysarthria techniques and implementing strategies during these moments in conversation. SLP then initiated language task teaching semantic  feature analysis (SFA) with pt participating effectively given min-A. D/t pt reports of increased difficulty with anomia in debates with family members and or when excited, SLP plans to implement language tasks mirroring scenarios impacting pt with increased complexity to continue targeting anomia and dysarthric speech.  05/31/23: SLP initiated education and instruction of anomia strategies, with handout provided. Introduced Chartered loss adjuster for family to utilize at home versus providing targeted word. Pt verbalized understanding and agreement.    PATIENT EDUCATION: Education details: See above Person educated: Patient Education method: Explanation Education comprehension: verbalized understanding and needs further education   GOALS: Goals reviewed with patient? Yes  SHORT TERM GOALS: Target date: 06/28/2023  Pt will demonstrate word finding strategies for anomia with 80% accuracy during structured task given min A.   Baseline: Goal status: MET  2.  Pt will demonstrate strategies for dysarthria with 80% accuracy during a structured task with min A. Baseline:  Goal status: MET  3.  Pt will complete HEP 5/7 days over 2 week period  Baseline:  Goal status: MET  4.  Pt will complete structured language tasks with 80% accuracy given min A  Baseline:  Goal status: MET   LONG TERM GOALS: Target date: 07/26/2023  Pt will report improved communication effectiveness via PROM by 2 points by LTG date   Baseline: CPIB=18; 22 post Goal status: MET  2. Pt will utilize anomia strategies in 80% of opportunities in conversation over 2 sessions with mod I  Baseline:  Goal status: MET  3.  Pt will be independent with HEP and report consistent carryover of strategies. Baseline:  Goal status: MET  4.  Pt will utilize dysarthria strategies to achieve clear, intelligible speech in 95% of opportunities over 2 sessions with mod I Baseline:  Goal status: MET   ASSESSMENT:  CLINICAL  IMPRESSION: Patient is a 58 y.o. man who was seen today for mild anomia and dysarthria following a TIA/CVA. Dysarthria improved and now only present with fatigue at end of day. He reports success carrying over slow rate and over articulation to compensate for dysarthria as needed. Word finding has improved per pt. Report. He is carrying over verbal compensation for aphasia as needed with mod I. He completes complex naming tasks with mod I. Yakir verbalizes energy conservation strategies post stroke with mod I. Goals met, d/c ST - pt in agreement.  OBJECTIVE IMPAIRMENTS: include aphasia and dysarthria These impairments are limiting patient from effectively communicating at home and in community. Factors affecting potential to achieve goals and functional outcome are  none . Patient will benefit from skilled SLP services to address above impairments and improve overall function.  REHAB POTENTIAL: Good  PLAN:  SLP FREQUENCY: 2x/week  SLP DURATION: 8 weeks  PLANNED INTERVENTIONS: Language facilitation, Environmental controls, Cueing hierachy, Internal/external aids, Functional tasks, Multimodal communication approach, SLP instruction and feedback, Compensatory strategies, Patient/family education, [phone] Treatment of speech (30 or 45 min) , and 62130- Speech Eval Sound Prod, Artic, Phon, Eval Compre, Express   SPEECH THERAPY DISCHARGE SUMMARY  Visits from Start of Care: 4  Current functional level related to goals / functional outcomes: See goals above   Remaining deficits: Sligh dysarthria with fatigue at end of day   Education / Equipment: HEP for dysarthria, compensations for dysarthria and aphasia   Patient agrees to discharge. Patient goals were met. Patient is being discharged due to meeting the stated rehab goals.Dara Hoyer, CCC-SLP 06/21/2023, 9:33 AM

## 2023-06-21 NOTE — Patient Instructions (Signed)
  Sometimes after a stroke, you have less energy or stamina. I think of this in terms of how many pennies you have to spend each day  You may need to prioritize where you are going to spend your pennies, for example, if you work you may not be up for going out or mowing the yard. If you kid has a sport event or competition, you may have to rest the next day  If you spend too many pennies in 1 day, you have borrowed from the next day and may not be able to do as much the next day  You can set your priorities of how and where you spend your pennies (energy)  Listen to your body  Sometimes your brain needs a 15 minute break in a quiet dark place to take a break from all of the processing and filtering   Break larger jobs into smaller tasks and take breaks as needed   Tips to help facilitate better attention, concentration, focus   Do harder, longer tasks when you are most alert/awake  Break down larger tasks into small parts  Limit distractions of TV, radio, conversation, e mails/texts, appliance noise, etc - if a job is important, do it in a quiet room  Be aware of how you are functioning in high stimulation environments such as large stores, parties, restaurants - any place with lots of lights, noise, signs etc  Group conversations may be more difficult to process than one on one conversations  Give yourself extra time to process conversation, reading materials, directions or information from your healthcare providers  Organization is key - clutters of laundry, mail, paperwork, dirty dishes - all make it more difficult to concentrate  Before you start a task, have all the needed supplies, directions, recipes ready and organized. This way you don't have to go looking for something in the middle of a task and become distracted.   Be aware of fatigue - take rests or breaks when needed to re-group and re-focus

## 2023-06-24 ENCOUNTER — Ambulatory Visit: Payer: 59 | Admitting: Occupational Therapy

## 2023-06-24 ENCOUNTER — Ambulatory Visit: Payer: 59

## 2023-06-24 VITALS — BP 130/77 | HR 80

## 2023-06-24 VITALS — BP 134/88 | HR 81

## 2023-06-24 DIAGNOSIS — M6281 Muscle weakness (generalized): Secondary | ICD-10-CM | POA: Diagnosis not present

## 2023-06-24 DIAGNOSIS — R278 Other lack of coordination: Secondary | ICD-10-CM

## 2023-06-24 DIAGNOSIS — R29898 Other symptoms and signs involving the musculoskeletal system: Secondary | ICD-10-CM

## 2023-06-24 DIAGNOSIS — R29818 Other symptoms and signs involving the nervous system: Secondary | ICD-10-CM

## 2023-06-24 DIAGNOSIS — R2689 Other abnormalities of gait and mobility: Secondary | ICD-10-CM

## 2023-06-24 DIAGNOSIS — R2681 Unsteadiness on feet: Secondary | ICD-10-CM

## 2023-06-24 NOTE — Therapy (Signed)
 OUTPATIENT PHYSICAL THERAPY NEURO TREATMENT/ 10th VISIT PROGRESS NOTE   Patient Name: Derrick Houston MRN: [id number] DOB:04-21-1965, 58 y.o., male Today's Date: 06/24/2023   PCP: Warnell Forester, PA-C REFERRING PROVIDER: Warnell Forester, PA-C  Physical Therapy Progress Note   Dates of Reporting Period:05/12/23-06/21/23  See Note below for Objective Data and Assessment of Progress/Goals.  Thank you for the referral of this patient. Westley Foots, PT, DPT, CBIS   END OF SESSION:  PT End of Session - 06/24/23 0813     Visit Number 11    Number of Visits 17    Date for PT Re-Evaluation 07/09/23    Authorization Type UHC    PT Start Time 0813   patient late   PT Stop Time 0845    PT Time Calculation (min) 32 min    Equipment Utilized During Treatment Gait belt    Activity Tolerance Patient tolerated treatment well    Behavior During Therapy WFL for tasks assessed/performed                Past Medical History:  Diagnosis Date   ETOH abuse    Stroke [facility])    History reviewed. No pertinent surgical history. There are no active problems to display for this patient.   ONSET DATE: 05/11/23 referral  REFERRING DIAG:  W09.81 (ICD-10-CM) - Personal history of transient ischemic attack (TIA), and cerebral infarction without residual deficits  R53.1 (ICD-10-CM) - Weakness of right side of body    THERAPY DIAG:  Other lack of coordination  Muscle weakness (generalized)  Other abnormalities of gait and mobility  Unsteadiness on feet  Rationale for Evaluation and Treatment: Rehabilitation  SUBJECTIVE:                                                                                                                                                                                             SUBJECTIVE STATEMENT: Patient reports doing well. Did have a bad HA on Tues, but okay by wed. Denies falls. Still with difficulty managing a drill over head or with just 1 hand.    Pt accompanied by: self  PERTINENT HISTORY: CVA, ETOH abuse  PAIN:  Are you having pain? Yes: NPRS scale: 4/10 Pain location: Frontal head/eyes Pain description: Headache/pressure   PRECAUTIONS: Fall   PATIENT GOALS: "to return to work"  OBJECTIVE:  Note: Objective measures were completed at Evaluation unless otherwise noted.  DIAGNOSTIC FINDINGS: 05/02/23 brain MRI IMPRESSION: Normal examination. No abnormality seen to explain the clinical presentation.  VITALS  Vitals:   06/24/23 0818  BP: 130/77  Pulse: 80  TREATMENT: NMR: -normal BOS yellow medball toss at trampoline R hand catch   -progressed to standing on dense blue foam -step up 4" box-> mat table   -progress to 5lb dumbbell -> 8lb dumbell  -forward lunge trunk twist bball bounce 2x25ft   -progressed to 8lb med ball bounce -lateral lunge with 8lb ball at chest  -progress to overhead carry   PATIENT EDUCATION: Education details: continue HEP Person educated: Patient Education method: Explanation Education comprehension: verbalized understanding  HOME EXERCISE PROGRAM: Access Code: [email or web address] URL: [email or web address] Date: 05/17/2023 Prepared by: Merry Lofty  Exercises - Side Stepping with Resistance at Ankles  - 1 x daily - 7 x weekly - 3 sets - 10 reps - Forward Monster Walks  - 1 x daily - 7 x weekly - 3 sets - 10 reps - Backward Monster Walks  - 1 x daily - 7 x weekly - 3 sets - 10 reps - Runner's Step Up/Down  - 1 x daily - 7 x weekly - 3 sets - 10 reps - Lateral Step Ups  - 1 x daily - 7 x weekly - 3 sets - 10 reps - Push-Up on Counter  - 1 x daily - 7 x weekly - 3 sets - 10 reps - 1 second hold  GOALS: Goals reviewed with patient? Yes  SHORT TERM GOALS: = LTG based on PT POC length   LONG TERM GOALS: Target date: 06/11/23  Pt will be independent with  final HEP for improved functional strength and mobility  Baseline: to be provided; provided Goal status: MET  2.  Pt will improve FGA to >/= 23/30 to demonstrate improved balance and reduced fall risk  Baseline: 18/30, 23/30 Goal status: MET  3.  Patient will negotiate at least 12 steps with no hand rails, reciprocal pattern, independently to demonstrate improved eccentric control of R knee Baseline: 4 steps step-to and required B HR, 12 steps no HR + SBA and reciprocal pattern Goal status: IN PROGRESS  4.  Patient will lift at least 50 lbs from the ground to his waist to simulate work-related duties Baseline: unable; 40lbs with great difficulty Goal status: IN PROGRESS   NEW LONG TERM GOALS: 07/09/23   1. Pt will be independent with final HEP for improved functional strength/endurance Baseline: to be updated Goal status: NEW  2.  Patient will lift at least 50 lbs from the ground to his waist to simulate work-related duties Baseline: 40lbs with great difficulty Goal status: NEW  3. Patient will be able to balance on simulated ladder step with B LE while completing overhead manual task with item weighing at least 5lbs for at least 1 min with supervision  Baseline: unable  Goal status: NEW     ASSESSMENT:  CLINICAL IMPRESSION: Patient seen for skilled PT session with emphasis on higher intensity tasks to match his work demands. His endurance does continue to improve. He is most limited by his right ankle and R UE strength. With increasing fatigue, R ankle instability increases. However, patient able to recover any LOB independently with appropriate balance strategies. Continue POC.    OBJECTIVE IMPAIRMENTS: Abnormal gait, decreased balance, decreased coordination, decreased knowledge of condition, decreased strength, impaired sensation, and impaired UE functional use.   ACTIVITY LIMITATIONS: carrying, lifting, bending, squatting, stairs, reach over head, locomotion level, and  caring for others  PARTICIPATION LIMITATIONS: meal prep, cleaning, interpersonal relationship, driving, shopping, community activity, occupation, and yard work  PERSONAL FACTORS: Age, Fitness, Profession, Time since onset of  injury/illness/exacerbation, and Transportation are also affecting patient's functional outcome.   REHAB POTENTIAL: Good  CLINICAL DECISION MAKING: Stable/uncomplicated  EVALUATION COMPLEXITY: Low  PLAN:  PT FREQUENCY: 2x/week 2x/ week (re-cert)  PT DURATION: 4 weeks 4 weeks (re-cert)  PLANNED INTERVENTIONS: 60454- PT Re-evaluation, 97110-Therapeutic exercises, 97530- Therapeutic activity, 97112- Neuromuscular re-education, 97535- Self Care, 09811- Manual therapy, [phone]- Gait training, [phone]- Aquatic Therapy, Patient/Family education, Balance training, Stair training, Dry Needling, Vestibular training, Visual/preceptual remediation/compensation, and DME instructions  PLAN FOR NEXT SESSION: high level balance, R LE eccentric control, heavy lifting, CHECK BP, return to work tasks (overhead, climbing higher stairs), basketball, work w/power tools and imitate ladders/step stools    Westley Foots, PT Westley Foots, PT, DPT, CBIS  06/24/2023, 9:57 AM

## 2023-06-24 NOTE — Therapy (Signed)
 OUTPATIENT OCCUPATIONAL THERAPY NEURO Treatment  Patient Name: Derrick Houston MRN: [id number] DOB:[birth year], 58 y.o., male Today's Date: 06/24/2023  PCP: Barnie Mort, PA-C  REFERRING PROVIDER: Arma Heading   END OF SESSION:  OT End of Session - 06/24/23 0938     Visit Number 7    Number of Visits 13    Date for OT Re-Evaluation 07/23/23    Authorization Type [employer] 2025, no auth required, VL: 90 combined    OT Start Time 0848    OT Stop Time 0928    OT Time Calculation (min) 40 min    Activity Tolerance Patient tolerated treatment well    Behavior During Therapy WFL for tasks assessed/performed                   Past Medical History:  Diagnosis Date   ETOH abuse    Stroke (HCC)    No past surgical history on file. There are no active problems to display for this patient.   ONSET DATE: 05/21/23 (referral date)  REFERRING DIAG: I63.9 (ICD-10-CM) - Impending cerebrovascular accident (HCC)   THERAPY DIAG:  Muscle weakness (generalized)  Other lack of coordination  Other symptoms and signs involving the musculoskeletal system  Other symptoms and signs involving the nervous system  Rationale for Evaluation and Treatment: Rehabilitation  SUBJECTIVE:   SUBJECTIVE STATEMENT: Pt reported feeling okay today. Pt reported "head pounded all day" on Tuesday though symptoms improved on Wednesday. Pt reported walking on golf course for first hole then used golf cart once he became tired.   Pt accompanied by: self  PERTINENT HISTORY: CVA, ETOH abuse, marijuana use, HTN, headache, prediabetes  05/02/23 brain MRI IMPRESSION: Normal examination. No abnormality seen to explain the clinical presentation.  PRECAUTIONS: Fall  WEIGHT BEARING RESTRICTIONS: No  PAIN:  Are you having pain? 3/10 headache  FALLS: Has patient fallen in last 6 months? No,   06/08/23 update: Pt reported near-fall on Friday 06/04/23 when painting a wall in home while standing on a  stepstool, pt reported off-balance though recovered ind. Pt reported completing task though "it was tough."  LIVING ENVIRONMENT:  Lives with: lives with their family Lives in: House/apartment Stairs: Yes: Internal: 2 steps; on right going up Has following equipment at home: Single point cane and Grab bars  PLOF: ind, Occupation: carpentry, currently driving  PATIENT GOALS: "to get back to work"  OBJECTIVE:  Note: Objective measures were completed at Evaluation unless otherwise noted.  HAND DOMINANCE: Right  ADLs: Overall ADLs: ind Transfers/ambulation related to ADLs: ind Eating: ind, including cutting with knife Grooming: ind UB Dressing: ind, no difficulty zippers/buttons LB Dressing: ind with extra time, no difficulty tying shoes Toileting: ind Bathing: ind, some difficulty reaching behind back with R hand though can manage with extra time Tub Shower transfers: ind Equipment: Grab bars and Walk in shower  IADLs: Shopping: ind, some assistance from family, some difficulty carrying heavy cases of water d/t weakness of R side Light housekeeping: ind vacuuming, laundry, dishwashing, sweeping - some fatigue with sweeping and vacuuming "I don't have the stamina I used to have." Meal Prep: often orders fast food currently, different from before the stroke. Pt reported increased difficulty with meal prep. Community mobility: currently driving, some help from family for errands Medication management: 05/31/23 update - Pt takes medication ind as prescribed, daughter helps retrieve medication from pharmacy.  Handwriting:  Pt reported slower speed of handwriting.  Work-related tasks: Pt reported unable to complete work-related carpentry  tasks, such as difficulty driving nail using hammer with RUE.  MOBILITY STATUS: Independent  POSTURE COMMENTS:  Ind sitting balance  ACTIVITY TOLERANCE: Activity tolerance: increased fatigue during the day. "I don't have the stamina I used to  have."  FUNCTIONAL OUTCOME MEASURES: Quick Dash: 36.4% deficit   06/21/23 - 38.6% deficit.   UPPER EXTREMITY ROM:    Active ROM Right eval Left eval  Shoulder flexion WFL with extra time [facility]  Shoulder abduction WFL with extra time, v/c to breathe d/t pt holding breath WFL  Shoulder adduction WFL with extra time [facility]  Shoulder extension    Shoulder internal rotation    Shoulder external rotation    Elbow flexion    Elbow extension    Wrist flexion    Wrist extension    Wrist ulnar deviation    Wrist radial deviation    Wrist pronation    Wrist supination    (Blank rows = not tested)  UPPER EXTREMITY MMT:     MMT Right eval Left eval  Shoulder flexion 3+ 5  Shoulder abduction    Shoulder adduction    Shoulder extension    Shoulder internal rotation    Shoulder external rotation    Middle trapezius    Lower trapezius    Elbow flexion    Elbow extension    Wrist flexion    Wrist extension    Wrist ulnar deviation    Wrist radial deviation    Wrist pronation    Wrist supination    (Blank rows = not tested)  HAND FUNCTION: Grip strength: Right: 40, 29, 33 (34 lbs average) lbs; Left: 68, 71, 74 (71 lbs average) lbs  Pt benefited from v/c to breathe during task d/t pt tending to hold breath during task.  COORDINATION: 9 Hole Peg test: Right: 24 sec; Left: 23 sec  Pt reported coordination is "off a little bit." Pt reported difficulty using TV remote. Pt reported dropping phone several times this week.  SENSATION: Pt denied tingling/numbness in BUE. Pt reported tingling in R foot "front half towards the toes. "  EDEMA: none noted  MUSCLE TONE: RUE: Within functional limits and LUE: Within functional limits  COGNITION: Overall cognitive status: Within functional limits for tasks assessed  Pt reported some difficulty remembering all seasoning and other ingredients when preparing a meal though recalls ingredients with extra time. Pt reported "I can't get my  words and everything to line up." Pt reported some difficulty with word-finding. Pt reported some very mild slurred speech later in the evening. Pt reported difficulties with short-term memory.  OT noted slightly increased time for pt to respond though pt answered questions appropriately.  Sunshine, pencil, bed: Pt recalled 3/3 words after approx. 3 minutes.  VISION: Subjective report: Pt reported lights "mess with my eyes at night" when driving and when riding in vehicle. Pt accurately read clock on wall. Baseline vision: Wears glasses for reading only Visual history:  none noted  VISION ASSESSMENT: Tracking: Visual tracking to all 4 quadrants, no concerns noted.  PERCEPTION: Not tested  PRAXIS: Not tested  OBSERVATIONS: Pt ambulated ind without A/E. Pt was pleasant and appeared well-kept.  TREATMENT DATE:     Self-Care Vital signs assessed, see vital signs. BP within therapeutic parameters. OT educated pt on BP parameters, energy conservation, fatigue management. Pt verbalized understanding of all. Pt denied symptoms of dizziness/lightheadedness throughout session.  TherAct HEP review: theraputty, theraband, shoulder AROM (see access codes below) - to improve carryover, to improve affected UE strengthening and coordination. Pt reported HEP going well. Pt ind retrieved HEP handouts from folder. HEP update: attempted progression to green theraband though pt reported sharp pain at approx. 100* shoulder flex, therefore tasks reverted to red theraband. Associated HEP goals updated, see below.  OT educated pt on pain threshold, avoiding sharp pain, avoiding compensatory movements, upright seated/standing posture, body mechanics and ergonomic principles. Pt returned demo and acknowledged understanding.   Standing with gait belt, Blaze pods at upright mirror - OT placed 6  BlazePods in front of patient and had patient tap pods with fingertips as pods lit up using OT Distraction and Scanning mode for fine motor coordination, gross motor coordination, upper extremity range of motion, reaction time, scanning and locating of items, bimanual coordination/trunk control, and endurance/stamina, dynamic standing balance.  Hits were as follows:  RUE: 74 hits for 2 min duration and 1.4 sec reaction time RUE: 72 hits for 2 min duration and 1.5 sec reaction time   PATIENT EDUCATION: Education details: see today's tx above Person educated: Patient Education method: Explanation, Demonstration, and Handouts Education comprehension: verbalized understanding and returned demonstration  HOME EXERCISE PROGRAM: 05/31/23 - Memory compensation strategies, Activities to keep thinking skills sharp (handouts, see pt instructions), green theraputty Access Code: [email or web address] 06/08/23 and 06/10/23 - Red Theraband and shoulder IR. Access Code: [facility] 06/21/23 - energy conservation handout   GOALS: Goals reviewed with patient? Yes  SHORT TERM GOALS: Target date: 06/25/23  Patient will demonstrate initial RUE HEP with visual handouts and 25% verbal cues or less for proper execution.  Baseline: new to outpt OT 06/24/23 - Pt ind demo'd understanding of all HEP using visual handouts. Goal status: MET  2.  Pt will recall or demo at least 3 memory compensation strategies. Baseline: Pt reported mild difficulty with word-finding and short-term memory recall. 06/14/23 - With min v/c, pt recalled write down items, use word associations, keeping items in same location and using consistent routine, using a basket by the door to keep important items. Goal status: MET  3.  Pt will demonstrate ability to retrieve a 3 lbs object from overhead shelf at 115*with RUE, demonstrating good control.  Baseline: Pt reported and demo'd decreased RUE grip strength, extra time to achieve full shoulder ROM of  RUE. 06/14/23 - Pt retrieved 5 lbs objects from overhead shelf with good control using RUE. Goal status: MET  4.  Pt will report improved efficiency and decreased fatigue when completing ADL LB dressing and IADL household management tasks, indicating improved activity tolerance.  Baseline: Pt reported extra time required for LB dressing and increased fatigue with IADL household management tasks. 06/14/23 - Pt reported still getting tired, taking break PRN "when I get tired." Goal status: in progress  LONG TERM GOALS: Target date: 07/23/23  Patient will ind demonstrate updated RUE HEP with visual handouts. Baseline: new to outpt OT 06/24/23 - Pt ind demo'd understanding of all HEP using visual handouts. Goal status: MET  2.  Pt will demo at least 50 lbs (average of 3 trials) RUE grip strength as needed to carpentry tasks. Baseline: Grip strength: Right: 40, 29, 33 (34 lbs average) lbs; Left:  68, 71, 74 (71 lbs average) lbs 06/14/23 - Right: 75 lbs, 65 lbs, 81 lbs (73.6 lbs average) Goal status: MET  3.  Patient will demo improved FM coordination as evidenced by completing nine-hole peg with use of RUE in 22 seconds or less.  Baseline: 9 Hole Peg test: Right: 24 sec; Left: 23 sec 06/14/23 - 9 hole peg test: Trial 1  - Right 22 sec with 1 drop. Trial 2 - Right 19 seconds with 0 drops. Goal status: MET  4.  Patient will demonstrate at least 16% improvement with quick Dash score (reporting 20.4% disability or less) indicating improved functional use of affected extremity.   Baseline: 36.4% deficit 06/21/23 - 38.6% deficit. Goal status: in progress  5.  Pt will ind complete simple simulated medication management task with 100% accuracy.  Baseline: Pt's daughter assists pt with managing medications. 06/14/23 - Pt reported managing all medications at home and daughter assists with picking up prescriptions. Pt completed simulated medication management task with 100% accuracy. Goal status: MET  6.  Pt  will report no more than moderate difficulty with work-related carpentry tasks per QuickDASH. Baseline: Pt reported unable to complete carpentry tasks for work per [employer]. Goal status: in progress  ASSESSMENT:  CLINICAL IMPRESSION: Pt tolerated tasks well. Pt met 1 STG and 1 LTG today, indicating good progress towards goals and excellent carryover of HEP. Pt demo'd some fatigue by end of standing tasks. Pt would benefit from skilled OT services in the outpatient setting to work on impairments as noted below to help pt return to PLOF as able.   PERFORMANCE DEFICITS: in functional skills including ADLs, IADLs, coordination, dexterity, proprioception, ROM, strength, flexibility, Fine motor control, Gross motor control, mobility, balance, body mechanics, endurance, vision, and UE functional use, cognitive skills including attention, energy/drive, and memory, and psychosocial skills including environmental adaptation.   IMPAIRMENTS: are limiting patient from ADLs, IADLs, rest and sleep, work, leisure, and social participation.   CO-MORBIDITIES: may have co-morbidities  that affects occupational performance. Patient will benefit from skilled OT to address above impairments and improve overall function.  MODIFICATION OR ASSISTANCE TO COMPLETE EVALUATION: No modification of tasks or assist necessary to complete an evaluation.  OT OCCUPATIONAL PROFILE AND HISTORY: Detailed assessment: Review of records and additional review of physical, cognitive, psychosocial history related to current functional performance.  CLINICAL DECISION MAKING: Moderate - several treatment options, min-mod task modification necessary  REHAB POTENTIAL: Good  EVALUATION COMPLEXITY: Moderate    PLAN:  OT FREQUENCY: 2x/week  OT DURATION: 6 weeks (dates extended to allow for scheduling)  PLANNED INTERVENTIONS: 40981 OT Re-evaluation, 97535 self care/ADL training, 19147 therapeutic exercise, 97530 therapeutic activity,  97112 neuromuscular re-education, 97140 manual therapy, 97035 ultrasound, 97018 paraffin, 82956 fluidotherapy, 97010 moist heat, 97010 cryotherapy, 97760 Orthotics management and training, 21308 Splinting (initial encounter), [id number] Subsequent splinting/medication, passive range of motion, functional mobility training, visual/perceptual remediation/compensation, energy conservation, patient/family education, and DME and/or AE instructions  RECOMMENDED OTHER SERVICES: PT and SLP evals completed  CONSULTED AND AGREED WITH PLAN OF CARE: Patient  PLAN FOR NEXT SESSION:  Monitor BP Functional standing tasks for activity tolerance, endurance FM coordination activities and general strengthening of affected UE (e.g. Blaze Pods, Edwina Barth, etc.)  Wynetta Emery, OT 06/24/2023, 9:46 AM

## 2023-06-28 ENCOUNTER — Ambulatory Visit: Payer: 59 | Admitting: Occupational Therapy

## 2023-06-28 ENCOUNTER — Ambulatory Visit: Payer: 59 | Admitting: Physical Therapy

## 2023-07-01 ENCOUNTER — Ambulatory Visit: Payer: 59 | Admitting: Physical Therapy

## 2023-07-01 ENCOUNTER — Encounter: Payer: 59 | Admitting: Speech Pathology

## 2023-07-01 ENCOUNTER — Ambulatory Visit: Payer: 59 | Admitting: Occupational Therapy

## 2023-07-01 VITALS — BP 150/97 | HR 80

## 2023-07-01 DIAGNOSIS — M6281 Muscle weakness (generalized): Secondary | ICD-10-CM

## 2023-07-01 DIAGNOSIS — R278 Other lack of coordination: Secondary | ICD-10-CM

## 2023-07-01 DIAGNOSIS — R29818 Other symptoms and signs involving the nervous system: Secondary | ICD-10-CM

## 2023-07-01 DIAGNOSIS — R29898 Other symptoms and signs involving the musculoskeletal system: Secondary | ICD-10-CM

## 2023-07-01 NOTE — Therapy (Signed)
 OUTPATIENT OCCUPATIONAL THERAPY NEURO Treatment  Patient Name: Derrick Houston MRN: [id number] DOB:07/23/1965, 58 y.o., male Today's Date: 07/01/2023  PCP: Modesta Messing  REFERRING PROVIDER: Arma Heading   END OF SESSION:  OT End of Session - 07/01/23 0857     Visit Number 0    Number of Visits 13    Date for OT Re-Evaluation 07/23/23    Authorization Type [employer] [id number], no auth required, VL: 90 combined    OT Start Time 0847    OT Stop Time 0856    OT Time Calculation (min) 9 min    Activity Tolerance Treatment limited secondary to medical complications (Comment)   elevated BP, symptomatic                   Past Medical History:  Diagnosis Date   ETOH abuse    Stroke (HCC)    No past surgical history on file. There are no active problems to display for this patient.   ONSET DATE: 05/21/23 (referral date)  REFERRING DIAG: I63.9 (ICD-10-CM) - Impending cerebrovascular accident (HCC)   THERAPY DIAG:  Muscle weakness (generalized)  Other lack of coordination  Other symptoms and signs involving the musculoskeletal system  Other symptoms and signs involving the nervous system  Rationale for Evaluation and Treatment: Rehabilitation  SUBJECTIVE:   SUBJECTIVE STATEMENT: Pt reported 4/10 headache pain today. Pt reported not feeling well. Pt reported headache yesterday which continued today. Pt reported headache "over my eyes bad today" which pt reports has happened before. Pt questioned if allergies might be contributing to headache.  Pt accompanied by: self  PERTINENT HISTORY: CVA, ETOH abuse, marijuana use, HTN, headache, prediabetes  05/02/23 brain MRI IMPRESSION: Normal examination. No abnormality seen to explain the clinical presentation.  PRECAUTIONS: Fall  WEIGHT BEARING RESTRICTIONS: No  PAIN:  Are you having pain? 4/10 headache  FALLS: Has patient fallen in last 6 months? No,   06/08/23 update: Pt reported near-fall on Friday  06/04/23 when painting a wall in home while standing on a stepstool, pt reported off-balance though recovered ind. Pt reported completing task though "it was tough."  LIVING ENVIRONMENT:  Lives with: lives with their family Lives in: House/apartment Stairs: Yes: Internal: 2 steps; on right going up Has following equipment at home: Single point cane and Grab bars  PLOF: ind, Occupation: carpentry, currently driving  PATIENT GOALS: "to get back to work"  OBJECTIVE:  Note: Objective measures were completed at Evaluation unless otherwise noted.  HAND DOMINANCE: Right  ADLs: Overall ADLs: ind Transfers/ambulation related to ADLs: ind Eating: ind, including cutting with knife Grooming: ind UB Dressing: ind, no difficulty zippers/buttons LB Dressing: ind with extra time, no difficulty tying shoes Toileting: ind Bathing: ind, some difficulty reaching behind back with R hand though can manage with extra time Tub Shower transfers: ind Equipment: Grab bars and Walk in shower  IADLs: Shopping: ind, some assistance from family, some difficulty carrying heavy cases of water d/t weakness of R side Light housekeeping: ind vacuuming, laundry, dishwashing, sweeping - some fatigue with sweeping and vacuuming "I don't have the stamina I used to have." Meal Prep: often orders fast food currently, different from before the stroke. Pt reported increased difficulty with meal prep. Community mobility: currently driving, some help from family for errands Medication management: 05/31/23 update - Pt takes medication ind as prescribed, daughter helps retrieve medication from pharmacy.  Handwriting:  Pt reported slower speed of handwriting.  Work-related tasks: Pt reported unable to  complete work-related carpentry tasks, such as difficulty driving nail using hammer with RUE.  MOBILITY STATUS: Independent  POSTURE COMMENTS:  Ind sitting balance  ACTIVITY TOLERANCE: Activity tolerance: increased fatigue  during the day. "I don't have the stamina I used to have."  FUNCTIONAL OUTCOME MEASURES: Quick Dash: 36.4% deficit   06/21/23 - 38.6% deficit.   UPPER EXTREMITY ROM:    Active ROM Right eval Left eval  Shoulder flexion WFL with extra time [facility]  Shoulder abduction WFL with extra time, v/c to breathe d/t pt holding breath WFL  Shoulder adduction WFL with extra time [facility]  Shoulder extension    Shoulder internal rotation    Shoulder external rotation    Elbow flexion    Elbow extension    Wrist flexion    Wrist extension    Wrist ulnar deviation    Wrist radial deviation    Wrist pronation    Wrist supination    (Blank rows = not tested)  UPPER EXTREMITY MMT:     MMT Right eval Left eval  Shoulder flexion 3+ 5  Shoulder abduction    Shoulder adduction    Shoulder extension    Shoulder internal rotation    Shoulder external rotation    Middle trapezius    Lower trapezius    Elbow flexion    Elbow extension    Wrist flexion    Wrist extension    Wrist ulnar deviation    Wrist radial deviation    Wrist pronation    Wrist supination    (Blank rows = not tested)  HAND FUNCTION: Grip strength: Right: 40, 29, 33 (34 lbs average) lbs; Left: 68, 71, 74 (71 lbs average) lbs  Pt benefited from v/c to breathe during task d/t pt tending to hold breath during task.  COORDINATION: 9 Hole Peg test: Right: 24 sec; Left: 23 sec  Pt reported coordination is "off a little bit." Pt reported difficulty using TV remote. Pt reported dropping phone several times this week.  SENSATION: Pt denied tingling/numbness in BUE. Pt reported tingling in R foot "front half towards the toes. "  EDEMA: none noted  MUSCLE TONE: RUE: Within functional limits and LUE: Within functional limits  COGNITION: Overall cognitive status: Within functional limits for tasks assessed  Pt reported some difficulty remembering all seasoning and other ingredients when preparing a meal though recalls  ingredients with extra time. Pt reported "I can't get my words and everything to line up." Pt reported some difficulty with word-finding. Pt reported some very mild slurred speech later in the evening. Pt reported difficulties with short-term memory.  OT noted slightly increased time for pt to respond though pt answered questions appropriately.  Sunshine, pencil, bed: Pt recalled 3/3 words after approx. 3 minutes.  VISION: Subjective report: Pt reported lights "mess with my eyes at night" when driving and when riding in vehicle. Pt accurately read clock on wall. Baseline vision: Wears glasses for reading only Visual history:  none noted  VISION ASSESSMENT: Tracking: Visual tracking to all 4 quadrants, no concerns noted.  PERCEPTION: Not tested  PRAXIS: Not tested  OBSERVATIONS: Pt ambulated ind without A/E. Pt was pleasant and appeared well-kept.  TREATMENT DATE:    Arrived, No Charge Vital signs assessed, see vital signs. BP noted to elevated and pt appeared unwell. Pt also reported feeling unwell. Pt reported "I knew BP was high," because "I felt it." Pt reported taking BP medication today.  OT educated pt on BP parameters. OT recommended to pt to call doctor about symptoms, to call 9-1-1 if symptoms worsen, and to stay with family or friends today to provide support PRN. Pt verbalized understanding of all. OT recommended to pt to call for ride home though pt politely declined. Pt safely exited clinic.  OT session ended early today secondary to elevated BP and pt symptomatic.   PATIENT EDUCATION: Education details: see today's tx above Person educated: Patient Education method: Explanation, Demonstration, and Handouts Education comprehension: verbalized understanding and returned demonstration  HOME EXERCISE PROGRAM: 05/31/23 - Memory compensation strategies,  Activities to keep thinking skills sharp (handouts, see pt instructions), green theraputty Access Code: [email or web address] 06/08/23 and 06/10/23 - Red Theraband and shoulder IR. Access Code: [email or web address] 06/21/23 - energy conservation handout   GOALS: Goals reviewed with patient? Yes  SHORT TERM GOALS: Target date: 06/25/23  Patient will demonstrate initial RUE HEP with visual handouts and 25% verbal cues or less for proper execution.  Baseline: new to outpt OT 06/24/23 - Pt ind demo'd understanding of all HEP using visual handouts. Goal status: MET  2.  Pt will recall or demo at least 3 memory compensation strategies. Baseline: Pt reported mild difficulty with word-finding and short-term memory recall. 06/14/23 - With min v/c, pt recalled write down items, use word associations, keeping items in same location and using consistent routine, using a basket by the door to keep important items. Goal status: MET  3.  Pt will demonstrate ability to retrieve a 3 lbs object from overhead shelf at 115*with RUE, demonstrating good control.  Baseline: Pt reported and demo'd decreased RUE grip strength, extra time to achieve full shoulder ROM of RUE. 06/14/23 - Pt retrieved 5 lbs objects from overhead shelf with good control using RUE. Goal status: MET  4.  Pt will report improved efficiency and decreased fatigue when completing ADL LB dressing and IADL household management tasks, indicating improved activity tolerance.  Baseline: Pt reported extra time required for LB dressing and increased fatigue with IADL household management tasks. 06/14/23 - Pt reported still getting tired, taking break PRN "when I get tired." Goal status: in progress  LONG TERM GOALS: Target date: 07/23/23  Patient will ind demonstrate updated RUE HEP with visual handouts. Baseline: new to outpt OT 06/24/23 - Pt ind demo'd understanding of all HEP using visual handouts. Goal status: MET  2.  Pt will demo at least 50 lbs (average of 3 trials)  RUE grip strength as needed to carpentry tasks. Baseline: Grip strength: Right: 40, 29, 33 (34 lbs average) lbs; Left: 68, 71, 74 (71 lbs average) lbs 06/14/23 - Right: 75 lbs, 65 lbs, 81 lbs (73.6 lbs average) Goal status: MET  3.  Patient will demo improved FM coordination as evidenced by completing nine-hole peg with use of RUE in 22 seconds or less.  Baseline: 9 Hole Peg test: Right: 24 sec; Left: 23 sec 06/14/23 - 9 hole peg test: Trial 1  - Right 22 sec with 1 drop. Trial 2 - Right 19 seconds with 0 drops. Goal status: MET  4.  Patient will demonstrate at least 16% improvement with quick Dash score (reporting 20.4% disability or less) indicating improved functional use of affected  extremity.   Baseline: 36.4% deficit 06/21/23 - 38.6% deficit. Goal status: in progress  5.  Pt will ind complete simple simulated medication management task with 100% accuracy.  Baseline: Pt's daughter assists pt with managing medications. 06/14/23 - Pt reported managing all medications at home and daughter assists with picking up prescriptions. Pt completed simulated medication management task with 100% accuracy. Goal status: MET  6.  Pt will report no more than moderate difficulty with work-related carpentry tasks per QuickDASH. Baseline: Pt reported unable to complete carpentry tasks for work per QuickDASH. 06/21/23 - Per QuickDASH, pt reported "unable" to complete work-related tasks.  Goal status: in progress  ASSESSMENT:  CLINICAL IMPRESSION: OT session ended early today secondary to elevated BP and pt symptomatic. Pt would benefit from skilled OT services in the outpatient setting to work on impairments as noted below to help pt return to PLOF as able.   PERFORMANCE DEFICITS: in functional skills including ADLs, IADLs, coordination, dexterity, proprioception, ROM, strength, flexibility, Fine motor control, Gross motor control, mobility, balance, body mechanics, endurance, vision, and UE functional use,  cognitive skills including attention, energy/drive, and memory, and psychosocial skills including environmental adaptation.   IMPAIRMENTS: are limiting patient from ADLs, IADLs, rest and sleep, work, leisure, and social participation.   CO-MORBIDITIES: may have co-morbidities  that affects occupational performance. Patient will benefit from skilled OT to address above impairments and improve overall function.  MODIFICATION OR ASSISTANCE TO COMPLETE EVALUATION: No modification of tasks or assist necessary to complete an evaluation.  OT OCCUPATIONAL PROFILE AND HISTORY: Detailed assessment: Review of records and additional review of physical, cognitive, psychosocial history related to current functional performance.  CLINICAL DECISION MAKING: Moderate - several treatment options, min-mod task modification necessary  REHAB POTENTIAL: Good  EVALUATION COMPLEXITY: Moderate    PLAN:  OT FREQUENCY: 2x/week  OT DURATION: 6 weeks (dates extended to allow for scheduling)  PLANNED INTERVENTIONS: 40981 OT Re-evaluation, 97535 self care/ADL training, 19147 therapeutic exercise, 97530 therapeutic activity, 97112 neuromuscular re-education, 97140 manual therapy, 97035 ultrasound, 97018 paraffin, 82956 fluidotherapy, 97010 moist heat, 97010 cryotherapy, 97760 Orthotics management and training, 21308 Splinting (initial encounter), [id number] Subsequent splinting/medication, passive range of motion, functional mobility training, visual/perceptual remediation/compensation, energy conservation, patient/family education, and DME and/or AE instructions  RECOMMENDED OTHER SERVICES: PT and SLP evals completed  CONSULTED AND AGREED WITH PLAN OF CARE: Patient  PLAN FOR NEXT SESSION:  Monitor BP Functional standing tasks for activity tolerance, endurance FM coordination activities and general strengthening of affected UE (e.g. Blaze Pods, Edwina Barth, etc.)  Wynetta Emery, OT 07/01/2023, 9:09 AM

## 2023-07-05 ENCOUNTER — Encounter: Payer: 59 | Admitting: Occupational Therapy

## 2023-07-05 ENCOUNTER — Ambulatory Visit: Payer: 59

## 2023-07-06 ENCOUNTER — Ambulatory Visit: Admitting: Occupational Therapy

## 2023-07-06 ENCOUNTER — Ambulatory Visit: Admitting: Physical Therapy

## 2023-07-06 VITALS — BP 144/94 | HR 89

## 2023-07-06 DIAGNOSIS — M6281 Muscle weakness (generalized): Secondary | ICD-10-CM | POA: Diagnosis not present

## 2023-07-06 DIAGNOSIS — R278 Other lack of coordination: Secondary | ICD-10-CM

## 2023-07-06 DIAGNOSIS — R29898 Other symptoms and signs involving the musculoskeletal system: Secondary | ICD-10-CM

## 2023-07-06 DIAGNOSIS — R2681 Unsteadiness on feet: Secondary | ICD-10-CM

## 2023-07-06 DIAGNOSIS — R29818 Other symptoms and signs involving the nervous system: Secondary | ICD-10-CM

## 2023-07-06 DIAGNOSIS — R2689 Other abnormalities of gait and mobility: Secondary | ICD-10-CM

## 2023-07-06 NOTE — Therapy (Signed)
 OUTPATIENT OCCUPATIONAL THERAPY NEURO Treatment / Discharge  Patient Name: Derrick Houston MRN: [id number] DOB:25-Sep-1965, 58 y.o., male Today's Date: 07/06/2023  PCP: Modesta Messing  REFERRING PROVIDER: Arma Heading   OCCUPATIONAL THERAPY DISCHARGE SUMMARY  Visits from Start of Care: 8  Current functional level related to goals / functional outcomes: Pt has met 100% of STG and LTG to satisfactory levels and is pleased with outcomes.   Remaining deficits: Pt has minimal remaining functional deficits or pain, primarily limited sometimes by headaches and fatigue though symptoms vary day-to-day based on pt report. See tx notes for additional details.  Education / Equipment: Pt has all needed materials and education. Pt understands how to continue on with self-management. See tx notes for more details.    Patient goals were met. Patient is being discharged due to meeting the stated rehab goals and pt is pleased with current functional level.      END OF SESSION:  OT End of Session - 07/06/23 1007     Visit Number 8    Number of Visits 13    Date for OT Re-Evaluation 07/23/23    Authorization Type UHC 2025, no auth required, VL: 90 combined    OT Start Time 0925    OT Stop Time 1003    OT Time Calculation (min) 38 min    Activity Tolerance Patient tolerated treatment well    Behavior During Therapy WFL for tasks assessed/performed                     Past Medical History:  Diagnosis Date   ETOH abuse    Stroke (HCC)    No past surgical history on file. There are no active problems to display for this patient.   ONSET DATE: 05/21/23 (referral date)  REFERRING DIAG: I63.9 (ICD-10-CM) - Impending cerebrovascular accident (HCC)   THERAPY DIAG:  Muscle weakness (generalized)  Other lack of coordination  Other symptoms and signs involving the musculoskeletal system  Other symptoms and signs involving the nervous system  Rationale for  Evaluation and Treatment: Rehabilitation  SUBJECTIVE:   SUBJECTIVE STATEMENT: Pt seen by PT prior to OT, and PT updated OT that pt's BP within therapeutic parameters today. Pt reported attempting ladders with supervision from son. Pt reported walking on golf course "a couple of holes." Pt reported no headache pain today: "feeling good."  Pt accompanied by: self  PERTINENT HISTORY: CVA, ETOH abuse, marijuana use, HTN, headache, prediabetes  05/02/23 brain MRI IMPRESSION: Normal examination. No abnormality seen to explain the clinical presentation.  PRECAUTIONS: Fall  WEIGHT BEARING RESTRICTIONS: No  PAIN:  Are you having pain? No  FALLS: Has patient fallen in last 6 months? No  06/08/23 update: Pt reported near-fall on Friday 06/04/23 when painting a wall in home while standing on a stepstool, pt reported off-balance though recovered ind. Pt reported completing task though "it was tough."  LIVING ENVIRONMENT:  Lives with: lives with their family Lives in: House/apartment Stairs: Yes: Internal: 2 steps; on right going up Has following equipment at home: Single point cane and Grab bars  PLOF: ind, Occupation: carpentry, currently driving  PATIENT GOALS: "to get back to work"  OBJECTIVE:  Note: Objective measures were completed at Evaluation unless otherwise noted.  HAND DOMINANCE: Right  ADLs: Overall ADLs: ind Transfers/ambulation related to ADLs: ind Eating: ind, including cutting with knife Grooming: ind UB Dressing: ind, no difficulty zippers/buttons LB Dressing: ind with extra time, no difficulty tying shoes Toileting:  ind Bathing: ind, some difficulty reaching behind back with R hand though can manage with extra time Tub Shower transfers: ind Equipment: Grab bars and Walk in shower  IADLs: Shopping: ind, some assistance from family, some difficulty carrying heavy cases of water d/t weakness of R side Light housekeeping: ind vacuuming, laundry, dishwashing,  sweeping - some fatigue with sweeping and vacuuming "I don't have the stamina I used to have." Meal Prep: often orders fast food currently, different from before the stroke. Pt reported increased difficulty with meal prep. Community mobility: currently driving, some help from family for errands Medication management: 05/31/23 update - Pt takes medication ind as prescribed, daughter helps retrieve medication from pharmacy.  Handwriting:  Pt reported slower speed of handwriting.  Work-related tasks: Pt reported unable to complete work-related carpentry tasks, such as difficulty driving nail using hammer with RUE.  MOBILITY STATUS: Independent  POSTURE COMMENTS:  Ind sitting balance  ACTIVITY TOLERANCE: Activity tolerance: increased fatigue during the day. "I don't have the stamina I used to have."  FUNCTIONAL OUTCOME MEASURES: Quick Dash: 36.4% deficit   06/21/23 - 38.6% deficit.  07/06/23 - 20.5% deficit    UPPER EXTREMITY ROM:    Active ROM Right eval Left eval  Shoulder flexion WFL with extra time [facility]  Shoulder abduction WFL with extra time, v/c to breathe d/t pt holding breath WFL  Shoulder adduction WFL with extra time [facility]  Shoulder extension    Shoulder internal rotation    Shoulder external rotation    Elbow flexion    Elbow extension    Wrist flexion    Wrist extension    Wrist ulnar deviation    Wrist radial deviation    Wrist pronation    Wrist supination    (Blank rows = not tested)  UPPER EXTREMITY MMT:     MMT Right eval Left eval  Shoulder flexion 3+ 5  Shoulder abduction    Shoulder adduction    Shoulder extension    Shoulder internal rotation    Shoulder external rotation    Middle trapezius    Lower trapezius    Elbow flexion    Elbow extension    Wrist flexion    Wrist extension    Wrist ulnar deviation    Wrist radial deviation    Wrist pronation    Wrist supination    (Blank rows = not tested)  HAND FUNCTION: Grip strength: Right:  40, 29, 33 (34 lbs average) lbs; Left: 68, 71, 74 (71 lbs average) lbs  Pt benefited from v/c to breathe during task d/t pt tending to hold breath during task.  COORDINATION: 9 Hole Peg test: Right: 24 sec; Left: 23 sec  Pt reported coordination is "off a little bit." Pt reported difficulty using TV remote. Pt reported dropping phone several times this week.  SENSATION: Pt denied tingling/numbness in BUE. Pt reported tingling in R foot "front half towards the toes. "  EDEMA: none noted  MUSCLE TONE: RUE: Within functional limits and LUE: Within functional limits  COGNITION: Overall cognitive status: Within functional limits for tasks assessed  Pt reported some difficulty remembering all seasoning and other ingredients when preparing a meal though recalls ingredients with extra time. Pt reported "I can't get my words and everything to line up." Pt reported some difficulty with word-finding. Pt reported some very mild slurred speech later in the evening. Pt reported difficulties with short-term memory.  OT noted slightly increased time for pt to respond though pt answered questions  appropriately.  Sunshine, pencil, bed: Pt recalled 3/3 words after approx. 3 minutes.  VISION: Subjective report: Pt reported lights "mess with my eyes at night" when driving and when riding in vehicle. Pt accurately read clock on wall. Baseline vision: Wears glasses for reading only Visual history:  none noted  VISION ASSESSMENT: Tracking: Visual tracking to all 4 quadrants, no concerns noted.  PERCEPTION: Not tested  PRAXIS: Not tested  OBSERVATIONS: Pt ambulated ind without A/E. Pt was pleasant and appeared well-kept.                                                                                                                           TREATMENT DATE:    TherAct OT assessed pt's progress towards goals, see below for updates.   Blaze Pods, standing with gait belt, supervision - OT placed  6  BlazePods in front of patient at tabletop and had patient tap pods with fingertips as pods lit up using OT Distraction and Scanning mode for fine motor coordination, gross motor coordination, upper extremity range of motion, reaction time, scanning and locating of items, sequencing of unfamiliar motor movements or tasks, motor planning, and endurance/stamina, standing tolerance and standing balance. Hits were as follows:  RUE: 118 hits for 2 min duration and 0.82 sec reaction time LUE: 120 hits for 2 min duration and 0.80 sec reaction time  RUE: 119 hits for 2 min duration and 0.81 sec reaction time LUE: 117 hits for 2 min duration and 0.83 sec reaction time  OT noted similar reaction time and number of hits with affected UE compared to unaffected UE.   Self-Care OT educated pt on energy conservation, BP management, BP parameters, healthy lifestyle, knowing limits and taking breaks PRN. Pt verbalized understanding.  OT educated pt on return to work caution and considerations including gradual return to work-related tasks. OT recommended to pt to f/u with MD about work recommendations and with supervisor to discuss work tasks as pt prepares to return to work.   OT educated pt on Long-handled sponge option for ADL bathing to decrease pain/discomfort at R shoulder. Pt returned demo and verbalized understanding.   PATIENT EDUCATION: Education details: see today's tx above Person educated: Patient Education method: Explanation, Demonstration, and Handouts Education comprehension: verbalized understanding and returned demonstration  HOME EXERCISE PROGRAM: 05/31/23 - Memory compensation strategies, Activities to keep thinking skills sharp (handouts, see pt instructions), green theraputty Access Code: [email or web address] 06/08/23 and 06/10/23 - Red Theraband and shoulder IR. Access Code: [facility] 06/21/23 - energy conservation handout   GOALS: Goals reviewed with patient? Yes  SHORT TERM GOALS: Target date:  06/25/23  Patient will demonstrate initial RUE HEP with visual handouts and 25% verbal cues or less for proper execution.  Baseline: new to outpt OT 06/24/23 - Pt ind demo'd understanding of all HEP using visual handouts. Goal status: MET  2.  Pt will recall or demo at least 3 memory compensation strategies. Baseline: Pt reported mild difficulty  with word-finding and short-term memory recall. 06/14/23 - With min v/c, pt recalled write down items, use word associations, keeping items in same location and using consistent routine, using a basket by the door to keep important items. Goal status: MET  3.  Pt will demonstrate ability to retrieve a 3 lbs object from overhead shelf at 115*with RUE, demonstrating good control.  Baseline: Pt reported and demo'd decreased RUE grip strength, extra time to achieve full shoulder ROM of RUE. 06/14/23 - Pt retrieved 5 lbs objects from overhead shelf with good control using RUE. Goal status: MET  4.  Pt will report improved efficiency and decreased fatigue when completing ADL LB dressing and IADL household management tasks, indicating improved activity tolerance.  Baseline: Pt reported extra time required for LB dressing and increased fatigue with IADL household management tasks. 06/14/23 - Pt reported still getting tired, taking break PRN "when I get tired." 07/06/23 - Pt reported cleaning up in stages room-by-room with breaks PRN, washing car, LB dressing ind "not impossible." Pt reported "I'm not as tired as I was."  Goal status: MET  LONG TERM GOALS: Target date: 07/23/23  Patient will ind demonstrate updated RUE HEP with visual handouts. Baseline: new to outpt OT 06/24/23 - Pt ind demo'd understanding of all HEP using visual handouts. Goal status: MET  2.  Pt will demo at least 50 lbs (average of 3 trials) RUE grip strength as needed to carpentry tasks. Baseline: Grip strength: Right: 40, 29, 33 (34 lbs average) lbs; Left: 68, 71, 74 (71 lbs average)  lbs 06/14/23 - Right: 75 lbs, 65 lbs, 81 lbs (73.6 lbs average) Goal status: MET  3.  Patient will demo improved FM coordination as evidenced by completing nine-hole peg with use of RUE in 22 seconds or less.  Baseline: 9 Hole Peg test: Right: 24 sec; Left: 23 sec 06/14/23 - 9 hole peg test: Trial 1  - Right 22 sec with 1 drop. Trial 2 - Right 19 seconds with 0 drops. Goal status: MET  4.  Patient will demonstrate at least 16% improvement with quick Dash score (reporting 20.4% disability or less) indicating improved functional use of affected extremity.   Baseline: 36.4% deficit 06/21/23 - 38.6% deficit. 07/06/23- 20.5% deficit Goal status: MET  5.  Pt will ind complete simple simulated medication management task with 100% accuracy.  Baseline: Pt's daughter assists pt with managing medications. 06/14/23 - Pt reported managing all medications at home and daughter assists with picking up prescriptions. Pt completed simulated medication management task with 100% accuracy. Goal status: MET  6.  Pt will report no more than moderate difficulty with work-related carpentry tasks per QuickDASH. Baseline: Pt reported unable to complete carpentry tasks for work per QuickDASH. 06/21/23 - Per QuickDASH, pt reported "unable" to complete work-related tasks.  07/06/23 - Per QuickDASH, pt reported moderate difficulty. Goal status: MET  ASSESSMENT:  CLINICAL IMPRESSION: Pt met 100% of STG and LTG today, demo'ing excellent progress towards goals. Pt tolerated tasks very well today. Pt reported asymptomatic throughout session. Pt has met 100% of STG and LTG to satisfactory levels and is pleased with outcomes. Pt has minimal remaining functional deficits or pain, primarily limited sometimes by headaches and fatigue though symptoms vary day-to-day based on pt report. Pt asymptomatic throughout session today. Pt has all needed materials and education. Pt understands how to continue on with self-management. See tx notes  for more details.    Patient goals were met. Patient is being discharged due  to meeting the stated rehab goals and pt is pleased with current functional level.  PERFORMANCE DEFICITS: in functional skills including ADLs, IADLs, coordination, dexterity, proprioception, ROM, strength, flexibility, Fine motor control, Gross motor control, mobility, balance, body mechanics, endurance, vision, and UE functional use, cognitive skills including attention, energy/drive, and memory, and psychosocial skills including environmental adaptation.   IMPAIRMENTS: are limiting patient from ADLs, IADLs, rest and sleep, work, leisure, and social participation.   CO-MORBIDITIES: may have co-morbidities  that affects occupational performance. Patient will benefit from skilled OT to address above impairments and improve overall function.  MODIFICATION OR ASSISTANCE TO COMPLETE EVALUATION: No modification of tasks or assist necessary to complete an evaluation.  OT OCCUPATIONAL PROFILE AND HISTORY: Detailed assessment: Review of records and additional review of physical, cognitive, psychosocial history related to current functional performance.  CLINICAL DECISION MAKING: Moderate - several treatment options, min-mod task modification necessary  REHAB POTENTIAL: Good  EVALUATION COMPLEXITY: Moderate    PLAN:  OT FREQUENCY: 2x/week  OT DURATION: 6 weeks (dates extended to allow for scheduling)  PLANNED INTERVENTIONS: 95621 OT Re-evaluation, 97535 self care/ADL training, 30865 therapeutic exercise, 97530 therapeutic activity, 97112 neuromuscular re-education, 97140 manual therapy, 97035 ultrasound, 97018 paraffin, 78469 fluidotherapy, 97010 moist heat, 97010 cryotherapy, 97760 Orthotics management and training, 62952 Splinting (initial encounter), [id number] Subsequent splinting/medication, passive range of motion, functional mobility training, visual/perceptual remediation/compensation, energy conservation,  patient/family education, and DME and/or AE instructions  RECOMMENDED OTHER SERVICES: PT and SLP evals completed  CONSULTED AND AGREED WITH PLAN OF CARE: Patient  PLAN FOR NEXT SESSION:  N/A - OT D/C completed today  Wynetta Emery, OT 07/06/2023, 10:24 AM

## 2023-07-06 NOTE — Therapy (Signed)
 OUTPATIENT PHYSICAL THERAPY NEURO TREATMENT   Patient Name: Derrick Houston MRN: [id number] DOB:[birth year], 57 y.o., male Today's Date: 07/06/2023   PCP: Warnell Forester, PA-C REFERRING PROVIDER: Warnell Forester, PA-C    END OF SESSION:  PT End of Session - 07/06/23 0846     Visit Number 12    Number of Visits 17    Date for PT Re-Evaluation 07/09/23    Authorization Type UHC    PT Start Time 0845    PT Stop Time 0924    PT Time Calculation (min) 39 min    Equipment Utilized During Treatment Gait belt    Activity Tolerance Patient tolerated treatment well    Behavior During Therapy WFL for tasks assessed/performed                Past Medical History:  Diagnosis Date   ETOH abuse    Stroke (HCC)    No past surgical history on file. There are no active problems to display for this patient.   ONSET DATE: 05/11/23 referral  REFERRING DIAG:  Z36.64 (ICD-10-CM) - Personal history of transient ischemic attack (TIA), and cerebral infarction without residual deficits  R53.1 (ICD-10-CM) - Weakness of right side of body    THERAPY DIAG:  Muscle weakness (generalized)  Other lack of coordination  Unsteadiness on feet  Other abnormalities of gait and mobility  Rationale for Evaluation and Treatment: Rehabilitation  SUBJECTIVE:                                                                                                                                                                                             SUBJECTIVE STATEMENT: Patient reports doing well. No headache today. "I am ready to get back to work". States he goes back on 4/14.   Pt accompanied by: self  PERTINENT HISTORY: CVA, ETOH abuse  PAIN:  Are you having pain? No  PRECAUTIONS: Fall   PATIENT GOALS: "to return to work"  OBJECTIVE:  Note: Objective measures were completed at Evaluation unless otherwise noted.  DIAGNOSTIC FINDINGS: 05/02/23 brain MRI IMPRESSION: Normal examination.  No abnormality seen to explain the clinical presentation.  VITALS  Vitals:   07/06/23 0849 07/06/23 0922  BP: 132/84 (!) 144/94  Pulse: 82 89  TREATMENT: Self-care/home management Assessed vitals (see above) and WNL   NMR  On black foam and gray balance board to imitate standing on ladder:  At mirror w/2# ankle weights on both arms for improved stability reaching out of BOS, UE coordination and imitation of work tasks:  Reaching for [employer] and placing them in bucket placed posterolaterally to R side, x2 minutes. Pt had minor anterior LOB initially, but no LOB w/activity and was able to stabilize well. CGA throughout  At rebounder, 1kg single arm ball tosses w/2# weights on BUEs for improved UE coordination, NMR of RUE and anticipatory balance strategies:  Round 1: catch/throw w/RUE only  Round 2: throw w/RUE, catch w/LUE  Round 3: throw w/LUE, catch w/RUE. This was most difficult for pt.  CGA throughout but no major LOB   Ther Act  Weighted standing windmills for improved functional core stability, RUE strength and functional mobility. Performed 5 reps w/10# KB on RUE, but too heavy, so regressed to 5# DB for 5 reps on RUE and 10 reps on LUE. Min cues to maintain proper shoulder position throughout, but due to limitations in spinal rotation, difficult for pt. No pain reported with activity.  On red floor mat, Turkish get ups w/3# DB, x7 reps per side. Max verbal cues initially for proper sequencing, but after ~4 reps, pt able to perform independently. No instability noted throughout but increased difficulty maintaining OH position of RUE    Reassessed BP at end of session (see above) and still WNL    PATIENT EDUCATION: Education details: continue HEP, plan to DC next session  Person educated: Patient Education method: Explanation Education comprehension:  verbalized understanding  HOME EXERCISE PROGRAM: Access Code: [email or web address] URL: [email or web address] Date: 05/17/2023 Prepared by: Merry Lofty  Exercises - Side Stepping with Resistance at Ankles  - 1 x daily - 7 x weekly - 3 sets - 10 reps - Forward Monster Walks  - 1 x daily - 7 x weekly - 3 sets - 10 reps - Backward Monster Walks  - 1 x daily - 7 x weekly - 3 sets - 10 reps - Runner's Step Up/Down  - 1 x daily - 7 x weekly - 3 sets - 10 reps - Lateral Step Ups  - 1 x daily - 7 x weekly - 3 sets - 10 reps - Push-Up on Counter  - 1 x daily - 7 x weekly - 3 sets - 10 reps - 1 second hold  GOALS: Goals reviewed with patient? Yes  SHORT TERM GOALS: = LTG based on PT POC length   LONG TERM GOALS: Target date: 06/11/23  Pt will be independent with final HEP for improved functional strength and mobility  Baseline: to be provided; provided Goal status: MET  2.  Pt will improve FGA to >/= 23/30 to demonstrate improved balance and reduced fall risk  Baseline: 18/30, 23/30 Goal status: MET  3.  Patient will negotiate at least 12 steps with no hand rails, reciprocal pattern, independently to demonstrate improved eccentric control of R knee Baseline: 4 steps step-to and required B HR, 12 steps no HR + SBA and reciprocal pattern Goal status: IN PROGRESS  4.  Patient will lift at least 50 lbs from the ground to his waist to simulate work-related duties Baseline: unable; 40lbs with great difficulty Goal status: IN PROGRESS   NEW LONG TERM GOALS: 07/09/23   1. Pt will be independent with final HEP for improved functional strength/endurance Baseline: to be updated Goal  status: NEW  2.  Patient will lift at least 50 lbs from the ground to his waist to simulate work-related duties Baseline: 40lbs with great difficulty Goal status: NEW  3. Patient will be able to balance on simulated ladder step with B LE while completing overhead manual task with item weighing at least  5lbs for at least 1 min with supervision  Baseline: unable  Goal status: NEW     ASSESSMENT:  CLINICAL IMPRESSION: Emphasis of skilled PT session on assessing stability w/complex balance tasks, NMR of RUE and safety w/return to work. Pt reported feeling good today w/no headache or pain. Pt states he has been working on standing on his ladder at home to prepare for return to work and is feeling more stable w/this. Pt most challenged by [place] get ups this date but did improve sequencing and stability of task w/practice. Pt in agreement to DC next session. Continue POC.    OBJECTIVE IMPAIRMENTS: Abnormal gait, decreased balance, decreased coordination, decreased knowledge of condition, decreased strength, impaired sensation, and impaired UE functional use.   ACTIVITY LIMITATIONS: carrying, lifting, bending, squatting, stairs, reach over head, locomotion level, and caring for others  PARTICIPATION LIMITATIONS: meal prep, cleaning, interpersonal relationship, driving, shopping, community activity, occupation, and yard work  PERSONAL FACTORS: Age, Fitness, Profession, Time since onset of injury/illness/exacerbation, and Transportation are also affecting patient's functional outcome.   REHAB POTENTIAL: Good  CLINICAL DECISION MAKING: Stable/uncomplicated  EVALUATION COMPLEXITY: Low  PLAN:  PT FREQUENCY: 2x/week 2x/ week (re-cert)  PT DURATION: 4 weeks 4 weeks (re-cert)  PLANNED INTERVENTIONS: 40981- PT Re-evaluation, 97110-Therapeutic exercises, 97530- Therapeutic activity, 97112- Neuromuscular re-education, 97535- Self Care, 19147- Manual therapy, [phone]- Gait training, [phone]- Aquatic Therapy, Patient/Family education, Balance training, Stair training, Dry Needling, Vestibular training, Visual/preceptual remediation/compensation, and DME instructions  PLAN FOR NEXT SESSION: Goals and DC   Kyaire Gruenewald E Alura Olveda, PT, DPT  07/06/2023, 9:31 AM

## 2023-07-08 ENCOUNTER — Ambulatory Visit: Payer: 59 | Admitting: Occupational Therapy

## 2023-07-08 ENCOUNTER — Encounter: Payer: 59 | Admitting: Speech Pathology

## 2023-07-08 ENCOUNTER — Ambulatory Visit: Payer: 59 | Admitting: Physical Therapy

## 2023-07-08 VITALS — BP 131/86 | HR 77

## 2023-07-08 DIAGNOSIS — R2689 Other abnormalities of gait and mobility: Secondary | ICD-10-CM

## 2023-07-08 DIAGNOSIS — R2681 Unsteadiness on feet: Secondary | ICD-10-CM

## 2023-07-08 DIAGNOSIS — M6281 Muscle weakness (generalized): Secondary | ICD-10-CM | POA: Diagnosis not present

## 2023-07-08 NOTE — Therapy (Signed)
 OUTPATIENT PHYSICAL THERAPY NEURO TREATMENT- DISCHARGE SUMMARY    Patient Name: Derrick Houston MRN: [id number] DOB:Oct 24, 1965, 58 y.o., male Today's Date: 07/08/2023   PCP: Warnell Forester, PA-C REFERRING PROVIDER: Warnell Forester, PA-C  PHYSICAL THERAPY DISCHARGE SUMMARY  Visits from Start of Care: 13  Current functional level related to goals / functional outcomes: Independent w/all ADLs    Remaining deficits: Decreased activity tolerance, R hemiplegia, headaches    Education / Equipment: HEP   Patient agrees to discharge. Patient goals were met. Patient is being discharged due to meeting the stated rehab goals.     END OF SESSION:  PT End of Session - 07/08/23 1019     Visit Number 13    Number of Visits 17    Date for PT Re-Evaluation 07/09/23    Authorization Type [employer]    PT Start Time 1018    PT Stop Time 1037   DC   PT Time Calculation (min) 19 min    Activity Tolerance Patient tolerated treatment well    Behavior During Therapy WFL for tasks assessed/performed                 Past Medical History:  Diagnosis Date   ETOH abuse    Stroke (HCC)    No past surgical history on file. There are no active problems to display for this patient.   ONSET DATE: 05/11/23 referral  REFERRING DIAG:  H84.69 (ICD-10-CM) - Personal history of transient ischemic attack (TIA), and cerebral infarction without residual deficits  R53.1 (ICD-10-CM) - Weakness of right side of body    THERAPY DIAG:  Unsteadiness on feet  Other abnormalities of gait and mobility  Muscle weakness (generalized)  Rationale for Evaluation and Treatment: Rehabilitation  SUBJECTIVE:                                                                                                                                                                                             SUBJECTIVE STATEMENT: Patient reports doing well. No headache today. "I am ready to get back to work". States he  goes back on 4/14.   Pt accompanied by: self  PERTINENT HISTORY: CVA, ETOH abuse  PAIN:  Are you having pain? No  PRECAUTIONS: Fall   PATIENT GOALS: "to return to work"  OBJECTIVE:  Note: Objective measures were completed at Evaluation unless otherwise noted.  DIAGNOSTIC FINDINGS: 05/02/23 brain MRI IMPRESSION: Normal examination. No abnormality seen to explain the clinical presentation.  VITALS  Vitals:   07/08/23 1020  BP: 131/86  Pulse: 77  TREATMENT: Self-care/home management Assessed vitals (see above) and WNL   There Act - LTG Assessment  On black foam and gray balance board to imitate standing on ladder:  5 Blaze pods on random reach setting for improved tolerance reaching overhead, imitation of return to work and dynamic balance.  Performed on 5 minute intervals with SBA guarding. - Round 1:  Pods placed on mirror above pt from 6-3 o'clock .  258 hits. Lifting 50# crate x5 reps for improved lifting technique and return to work activities. Pt reported crate was "heavy" but was able to lift well w/no instability noted.  Discussed returning to PT in future if balance declines, pt verbalized understanding.    PATIENT EDUCATION: Education details: goal results, how to return to PT in future if needed  Person educated: Patient Education method: Explanation Education comprehension: verbalized understanding  HOME EXERCISE PROGRAM: Access Code: [email or web address] URL: [email or web address] Date: 05/17/2023 Prepared by: Merry Lofty  Exercises - Side Stepping with Resistance at Ankles  - 1 x daily - 7 x weekly - 3 sets - 10 reps - Forward Monster Walks  - 1 x daily - 7 x weekly - 3 sets - 10 reps - Backward Monster Walks  - 1 x daily - 7 x weekly - 3 sets - 10 reps - Runner's Step Up/Down  - 1 x daily - 7 x weekly - 3 sets - 10 reps -  Lateral Step Ups  - 1 x daily - 7 x weekly - 3 sets - 10 reps - Push-Up on Counter  - 1 x daily - 7 x weekly - 3 sets - 10 reps - 1 second hold  GOALS: Goals reviewed with patient? Yes  SHORT TERM GOALS: = LTG based on PT POC length   LONG TERM GOALS: Target date: 06/11/23  Pt will be independent with final HEP for improved functional strength and mobility  Baseline: to be provided; provided Goal status: MET  2.  Pt will improve FGA to >/= 23/30 to demonstrate improved balance and reduced fall risk  Baseline: 18/30, 23/30 Goal status: MET  3.  Patient will negotiate at least 12 steps with no hand rails, reciprocal pattern, independently to demonstrate improved eccentric control of R knee Baseline: 4 steps step-to and required B HR, 12 steps no HR + SBA and reciprocal pattern Goal status: IN PROGRESS  4.  Patient will lift at least 50 lbs from the ground to his waist to simulate work-related duties Baseline: unable; 40lbs with great difficulty Goal status: IN PROGRESS   NEW LONG TERM GOALS: 07/09/23   1. Pt will be independent with final HEP for improved functional strength/endurance Baseline: to be updated Goal status: MET  2.  Patient will lift at least 50 lbs from the ground to his waist to simulate work-related duties Baseline: 40lbs with great difficulty Goal status: MET  3. Patient will be able to balance on simulated ladder step with B LE while completing overhead manual task with item weighing at least 5lbs for at least 1 min with supervision  Baseline: unable; met on 3/25   Goal status: MET     ASSESSMENT:  CLINICAL IMPRESSION: Emphasis of skilled PT session on LTG assessment and DC from PT. Pt has met all 3 LTGs, demonstrating proper lifting technique of 50# item from floor and ability to stabilize on "ladder" for 5 minutes reaching overhead without weight and >3 minutes w/added weight. Pt reports he feels good about returning to work and knows to  take several  breaks and stay hydrated. Pt verbalized understanding w/how to return to PT in future if needed and is agreeable to DC today. Pt is independent w/all ADLs, is a low fall risk and is mostly limited by decreased endurance but overall has made significant gains in PT.    OBJECTIVE IMPAIRMENTS: Abnormal gait, decreased balance, decreased coordination, decreased knowledge of condition, decreased strength, impaired sensation, and impaired UE functional use.   ACTIVITY LIMITATIONS: carrying, lifting, bending, squatting, stairs, reach over head, locomotion level, and caring for others  PARTICIPATION LIMITATIONS: meal prep, cleaning, interpersonal relationship, driving, shopping, community activity, occupation, and yard work  PERSONAL FACTORS: Age, Fitness, Profession, Time since onset of injury/illness/exacerbation, and Transportation are also affecting patient's functional outcome.   REHAB POTENTIAL: Good  CLINICAL DECISION MAKING: Stable/uncomplicated  EVALUATION COMPLEXITY: Low  PLAN:  PT FREQUENCY: 2x/week 2x/ week (re-cert)  PT DURATION: 4 weeks 4 weeks (re-cert)  PLANNED INTERVENTIONS: 81191- PT Re-evaluation, 97110-Therapeutic exercises, 97530- Therapeutic activity, 97112- Neuromuscular re-education, 97535- Self Care, 47829- Manual therapy, [phone]- Gait training, [phone]- Aquatic Therapy, Patient/Family education, Balance training, Stair training, Dry Needling, Vestibular training, Visual/preceptual remediation/compensation, and DME instructions   Jill Alexanders Lillar Bianca, PT, DPT  07/08/2023, 10:38 AM
